# Patient Record
Sex: Male | Born: 1970
Health system: Southern US, Community
[De-identification: ages and names within clinical notes are randomized; demographics above are authoritative.]

## PROBLEM LIST (undated history)

## (undated) DIAGNOSIS — K429 Umbilical hernia without obstruction or gangrene: Secondary | ICD-10-CM

## (undated) DIAGNOSIS — K824 Cholesterolosis of gallbladder: Secondary | ICD-10-CM

---

## 2005-10-25 HISTORY — PX: ANTERIOR CRUCIATE LIGAMENT REPAIR: SHX115

## 2009-03-07 ENCOUNTER — Emergency Department (HOSPITAL_COMMUNITY): Admission: EM | Admit: 2009-03-07 | Discharge: 2009-03-07 | Payer: Self-pay | Admitting: Emergency Medicine

## 2010-01-07 IMAGING — CR DG CHEST 2V
2 series · 2 of 2 positions shown · non-contrast
Comparison: None.

CLINICAL DATA: 37-year-old male with chest pain on the left side.

CHEST - 2 VIEW

[w chest pa]
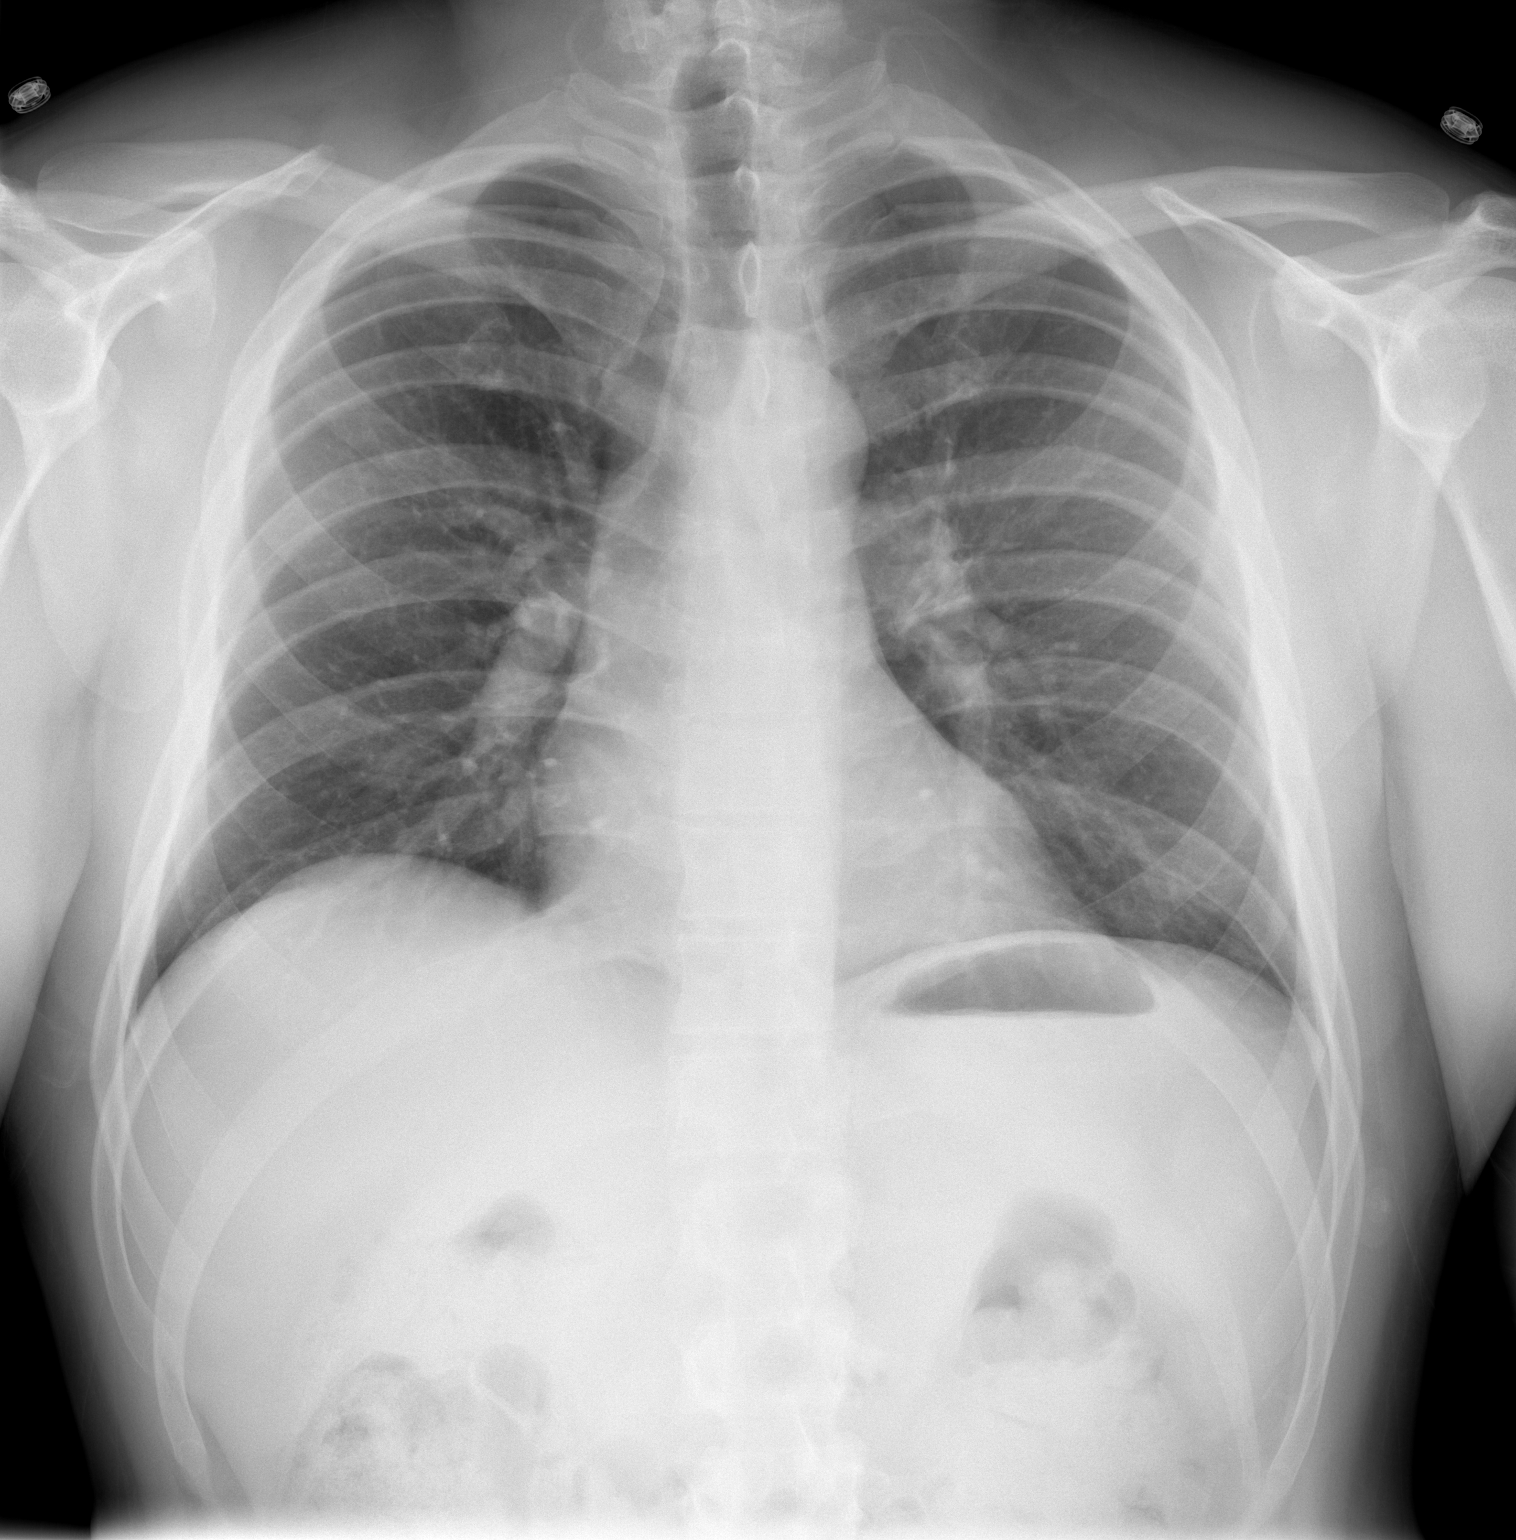

[w chest lat]
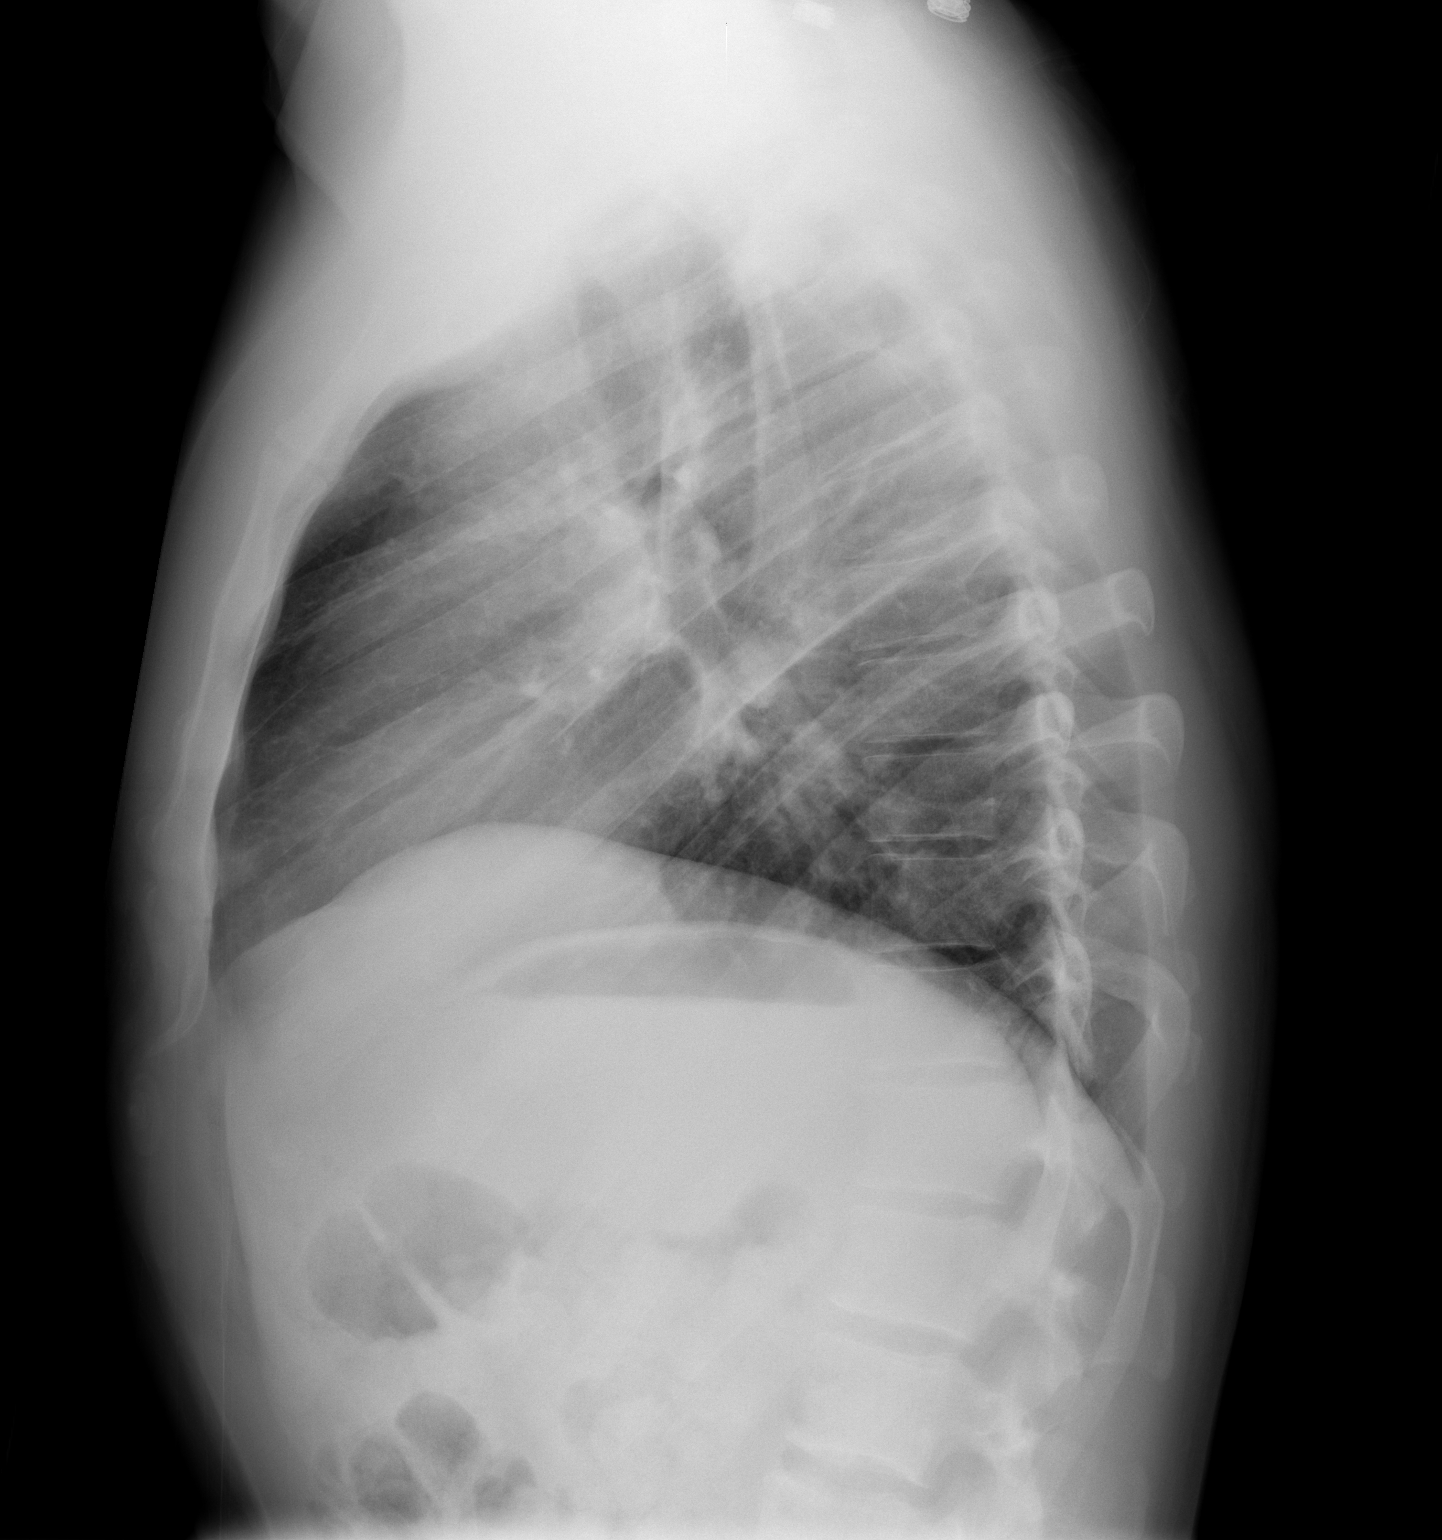

[2 of 2 positions shown; findings below may reference images not displayed]

FINDINGS: Shallow lung volumes.  Cardiac size and mediastinal
contours are within normal limits.  Crowding of markings at the
lung bases, otherwise the lungs are clear.  No pneumothorax or
pleural effusion. Visualized tracheal air column is within normal
limits.  No acute osseous abnormality identified.
IMPRESSION: Low lung volumes, otherwise no acute cardiopulmonary abnormality.

## 2010-11-12 ENCOUNTER — Ambulatory Visit
Admission: RE | Admit: 2010-11-12 | Discharge: 2010-11-12 | Payer: Self-pay | Source: Home / Self Care | Attending: Orthopedic Surgery | Admitting: Orthopedic Surgery

## 2010-11-18 NOTE — Op Note (Addendum)
  NAMEJOBE, MUTCH              ACCOUNT NO.:  [id number]  MEDICAL RECORD NO.:  [id number]          PATIENT TYPE:  AMB  LOCATION:  DSC                          FACILITY:  [facility]  PHYSICIAN:  Loreta Ave, M.D. DATE OF BIRTH:  11/19/1970  DATE OF PROCEDURE:  11/12/2010 DATE OF DISCHARGE:                              OPERATIVE REPORT   PREOPERATIVE DIAGNOSES:  Right shoulder chronic impingement, partial tearing of rotator cuff, and distal clavicle osteolysis.  POSTOPERATIVE DIAGNOSES:  Right shoulder chronic impingement, partial tearing of rotator cuff, and distal clavicle osteolysis.  With moderate tearing of supraspinatus tendon crescent region, on the bottom and top. Complex tearing, anterior and posterior superior labrum without biceps. Anchor detachment.  PROCEDURE:  Right shoulder exam under anesthesia, arthroscopy, debridement of labrum and rotator cuff.  Bursectomy, acromioplasty, coracoacromial ligament release.  Excision of distal clavicle.  SURGEON:  Loreta Ave, M.D.  ASSISTANT:  Zonia Kief, PA, present throughout the entire case; necessary for timely completion of procedure.  ANESTHESIA:  General.  BLOOD LOSS:  Minimal.  SPECIMENS:  None.  COUNTS:  None.  COMPLICATIONS:  None.  DRESSINGS:  Soft compressive sling.  PROCEDURE:  The patient was brought to the operating room, placed on the operating table in supine position.  After adequate anesthesia had been obtained, shoulder examined.  Good motion, a little increased external rotation, but no instability.  Placed in a beach-chair position on the shoulder positioner, prepped and draped in usual sterile fashion.  Three portals; anterior, posterior, and lateral.  Arthroscope was introduced. Shoulder distended and inspected.  Complex tearing, anterior and posterior superior labrum.  All this debrided back to a stable surface. The biceps tendon and anchor still looked good.  Fair amount of  tearing in the crescent region, supraspinatus tendon laterally.  Nothing full- thickness.  This was very anterior and not posterior and nothing that looked like internal impingement.  The infraspinatus looked great. Articular cartilage looked great.  Capsule and ligamentous structures intact.  Cannula redirected subacromially.  Marked reactive bursitis debrided.  Abrasive tearing on the top of the cuff, again nothing full- thickness.  Debrided it.  Acromioplasty from type 3 to type 1 acromion. CA ligament release.  Distal clavicle, grade 3 changes.  Periarticular spurs, lateral centimeter of clavicle resected.  At completion, adequacy of decompression confirmed, viewing from all portals. Instruments and fluid removed.  Portals closed with nylon.  Sterile compressive dressing applied.  Sling applied.  Anesthesia reversed. Brought to the recovery room.  Tolerated surgery well.  No complications.     Loreta Ave, M.D.     DFM/MEDQ  D:  11/12/2010  T:  11/13/2010  Job:  [id number]  Electronically Signed by Mckinley Jewel M.D. on 11/18/2010 04:21:52 PM

## 2011-02-02 LAB — CK TOTAL AND CKMB (NOT AT ARMC): CK, MB: 1.5 ng/mL (ref 0.3–4.0)

## 2011-02-02 LAB — DIFFERENTIAL
Basophils Absolute: 0 10*3/uL (ref 0.0–0.1)
Lymphocytes Relative: 20 % (ref 12–46)
Monocytes Absolute: 0.5 10*3/uL (ref 0.1–1.0)
Monocytes Relative: 6 % (ref 3–12)
Neutro Abs: 5.8 10*3/uL (ref 1.7–7.7)

## 2011-02-02 LAB — BASIC METABOLIC PANEL
CO2: 28 mEq/L (ref 19–32)
Chloride: 105 mEq/L (ref 96–112)
GFR calc Af Amer: >60 mL/min (ref >60)
Sodium: 138 mEq/L (ref 135–145)

## 2011-02-02 LAB — CBC
HCT: 42.8 % (ref 39.0–52.0)
Hemoglobin: 15 g/dL (ref 13.0–17.0)
MCHC: 35 g/dL (ref 30.0–36.0)
MCV: 87.1 fL (ref 78.0–100.0)
RBC: 4.92 MIL/uL (ref 4.22–5.81)

## 2011-03-09 NOTE — Consult Note (Signed)
[name], [name]              ACCOUNT NO.:  [id number]   MEDICAL RECORD NO.:  [id number]          PATIENT TYPE:  EMS   LOCATION:  MAJO                         FACILITY:  [facility]   PHYSICIAN:  [name], M.D.DATE OF BIRTH:  [birth year]   DATE OF CONSULTATION:  03/07/2009  DATE OF DISCHARGE:  03/07/2009                                 CONSULTATION   PRIMARY CARE PHYSICIAN:  [name], M.D.   CHIEF COMPLAINT:  Chest pain.   HISTORY OF PRESENT ILLNESS:  The patient is a 40 year old white male  with past medical history of hypertension, currently diet controlled who  had been in usual state of health.  He has been weaned off of his  antihypertensive medications for several months now and has actually  blood pressure has been monitored and looks to be within normal range.  He said that yesterday he worked out excessively after having some time  off and started having some severe pain over the right below the left  breast onto left axillary side.  When taking a deep inspiration he did  feel quite a bit of pain.  He noted this pain approximately 7:00 a.m.  this morning when he woke up.  He was up around 2:00 a.m. caring for his  son and at that time he had no pain whatsoever.  When he woke up at 7:00  this morning he said the pain was mild about 3-4/10 but quite  noticeable.  It did not radiate.  He did not really have associated  shortness of breath but again had pain when he took deep inspiration.  The pain did not migraine.  He had no nausea, vomiting, no  lightheadedness.  However, the symptoms continued to persist over the  course of the morning and seemed to get worse.  The patient knows me  personally and contacted me.  When I advised him that he needs to be  evaluated formally and was advised that his PCP would likely with  complaint of chest pain and pain with deep inspiration be referred to  the emergency room.  I arranged to me to the patient in the emergency  room myself.  On arrival to the emergency room the patient was sent in  and he had vitals checked.  His blood pressure was noted to be 147/82.  The rest of his vitals including respiratory rate, heart rate and  temperature were all completely normal.  When I evaluated the patient  while he still complained of severe pain.  He denies any headaches,  vision changes, dysphagia, palpitations, shortness of breath, wheezing,  coughing, fevers, chills and nausea, vomiting, abdominal pain,  hematuria, dysuria, constipation, diarrhea, focal numbness, weakness or  pain.   REVIEW OF SYSTEMS:  Otherwise negative.   PAST MEDICAL HISTORY:  Hypertension, diet-controlled.   MEDICATIONS:  He is taking some, recently last couple of weeks, over-the-  counter workout supplements, but no prescribed medications.   ALLERGIES:  He has an allergy to PENICILLIN.   SOCIAL HISTORY:  Denies any tobacco, alcohol or drug use.   FAMILY HISTORY:  Negative for  any heart disease.   PHYSICAL EXAM:  VITAL SIGNS:  The patient's vitals on admission 147/82.  This came down to 125/62 after the patient received some Vicodin.  He  noted however that this did not really change much quality of pain.  However, respiratory rate is 18, temperature afebrile, heart rate 74.  GENERAL:  He is alert and oriented x3.  He does not look to be in mild  distress, even though he complains of pain being quite significant about  7-8/10.  HEENT:  Normocephalic, atraumatic.  Mucous membranes are moist.  He has  no carotid bruits.  HEART:  Regular rate and rhythm.  S1-S2.  LUNGS:  Clear to auscultation bilaterally.  ABDOMEN:  Soft, nontender and nondistended.  Positive bowel sounds.  EXTREMITIES:  No clubbing, cyanosis or edema.  He has some focal  tenderness underneath his left breast in that area.  It does not go into  his abdominal cavity or abdominal region.   LAB WORK:  A chest x-ray noted lower lung volumes secondary to  suboptimal  inspiration.  No signs of any focal infiltrates.  EKG showed  a completely normal sinus rhythm, no ST-T wave abnormalities.  D-dimer  less than 0.22, troponin-I 0.01.  CPK 116, MB 1.5, sodium 138, potassium  3.8, chloride 105, BUN 14, creatinine 1.2, glucose 94, white count 8.1  no shift, H and H 15 and 43, MCV 87, platelet count 213.   ASSESSMENT/PLAN:  Musculoskeletal chest pain.  The patient was ruled out  for a deep venous thrombosis with a normal D-dimer.  He has low index of  suspicion for heart history given his lack of family history, risk  factors with a blood pressure that is diet controlled.  He is in  excellent physical condition and notes some pain in that area with more  moving or twisting.  Normal EKG and normal cardiac markers.  I am  comfortable saying that this is almost certainly musculoskeletal chest  pain.  We initially responded with trying some Vicodin this did not seem  to do much so therefore I am discharging the patient.  The plan will be  discharging the patient on 10 mg of Toradol q.8 hours for 24 hours then  10 mg q.8 hours p.r.n. for no more than four more days.  I advised the  patient to drink lots of water with Toradol.  I am also giving him  Percocet 5/325 one p.o. q.6 hours p.r.n. for pain a total #20.  Advised  the patient that if the symptoms have not improved over the next few  days or worsening to contact his PCP, or if he has any radiation of pain  elsewhere.  Although again I am comfortable now thinking that this is  not a life-threatening illness or injury requiring hospital admission.      [name], M.D.  Electronically Signed     SKK/MEDQ  D:  03/07/2009  T:  03/08/2009  Job:  [id number]   cc:   Dr. Heath Snyder

## 2012-10-25 HISTORY — PX: ROTATOR CUFF REPAIR: SHX139

## 2017-10-14 DIAGNOSIS — Z3009 Encounter for other general counseling and advice on contraception: Secondary | ICD-10-CM | POA: Diagnosis not present

## 2017-12-30 DIAGNOSIS — Z302 Encounter for sterilization: Secondary | ICD-10-CM | POA: Diagnosis not present

## 2018-01-04 DIAGNOSIS — B354 Tinea corporis: Secondary | ICD-10-CM | POA: Diagnosis not present

## 2018-01-04 DIAGNOSIS — R5383 Other fatigue: Secondary | ICD-10-CM | POA: Diagnosis not present

## 2018-01-04 DIAGNOSIS — F411 Generalized anxiety disorder: Secondary | ICD-10-CM | POA: Diagnosis not present

## 2018-01-04 DIAGNOSIS — E559 Vitamin D deficiency, unspecified: Secondary | ICD-10-CM | POA: Diagnosis not present

## 2018-07-19 ENCOUNTER — Encounter (HOSPITAL_COMMUNITY): Payer: Self-pay | Admitting: Emergency Medicine

## 2018-07-19 ENCOUNTER — Ambulatory Visit (HOSPITAL_COMMUNITY)
Admit: 2018-07-19 | Discharge: 2018-07-19 | Disposition: A | Discharge status: Home / Self Care | Payer: BLUE CROSS/BLUE SHIELD | Attending: Family Medicine | Admitting: Family Medicine

## 2018-07-19 ENCOUNTER — Ambulatory Visit (HOSPITAL_COMMUNITY)
Admission: EM | Admit: 2018-07-19 | Discharge: 2018-07-19 | Disposition: A | Discharge status: Home / Self Care | Payer: BLUE CROSS/BLUE SHIELD | Attending: Family Medicine | Admitting: Family Medicine

## 2018-07-19 DIAGNOSIS — K824 Cholesterolosis of gallbladder: Secondary | ICD-10-CM | POA: Diagnosis not present

## 2018-07-19 DIAGNOSIS — K420 Umbilical hernia with obstruction, without gangrene: Secondary | ICD-10-CM | POA: Insufficient documentation

## 2018-07-19 DIAGNOSIS — Z880 Allergy status to penicillin: Secondary | ICD-10-CM | POA: Diagnosis not present

## 2018-07-19 DIAGNOSIS — R1010 Upper abdominal pain, unspecified: Secondary | ICD-10-CM | POA: Diagnosis not present

## 2018-07-19 MED ORDER — ACETAMINOPHEN 325 MG PO TABS
650.0000 mg | ORAL_TABLET | Freq: Once | ORAL | Status: AC
  Administered 2018-07-19: 650 mg via ORAL

## 2018-07-19 MED ORDER — HYDROCODONE-ACETAMINOPHEN 5-325 MG PO TABS
ORAL_TABLET | ORAL | Status: AC
  Filled 2018-07-19: qty 1

## 2018-07-19 MED ORDER — ACETAMINOPHEN 325 MG PO TABS
ORAL_TABLET | ORAL | Status: AC
  Filled 2018-07-19: qty 2

## 2018-07-19 MED ORDER — HYDROCODONE-ACETAMINOPHEN 5-325 MG PO TABS: 1.0000 | ORAL_TABLET | Freq: Four times a day (QID) | ORAL | 0 refills | Status: DC | PRN

## 2018-07-19 MED ORDER — HYDROCODONE-ACETAMINOPHEN 5-325 MG PO TABS: 1.0000 | ORAL_TABLET | Freq: Once | ORAL | Status: DC

## 2018-07-19 NOTE — Discharge Instructions (Signed)
It was nice meeting you!!  Your ultrasound is scheduled for 1:45 today at [facility] Please arrive there 15 minutes before your appointment I will prescribe you some hydrocodone for pain

## 2018-07-19 NOTE — ED Notes (Signed)
PT has [place] appt at [facility] at 1:45 pm

## 2018-07-19 NOTE — ED Triage Notes (Signed)
PT reports epigastric pain that occurred earlier this week, but resolved. Pain returned last night. PT reports swelling in area and is concerned about a hernia.

## 2018-07-19 NOTE — ED Notes (Signed)
Bed: UC01 Expected date:  Expected time:  Means of arrival:  Comments: Appointments 

## 2018-07-19 NOTE — ED Provider Notes (Signed)
[facility]    CSN: [id number] Arrival date & time: 07/19/18  0932     History   Chief Complaint Chief Complaint  Patient presents with  . Abdominal Pain    HPI Breylan Lefevers is a 47 y.o. male.   Patient is a 47 year old male that presents with mid and upper abdominal pain worsening since Friday.  Most of the pain is around the umbilicus with swelling.  Very tender to touch.  He denies any associated nausea, vomiting, diarrhea, constipation.  Last night he had a hard time sleeping due to the pain.  He has not taken anything for pain.  He denies any recent heavy lifting.  He has been exercising but denies any strenuous exercise.  Denies any history of hernias.  He denies any chest pain, shortness of breath, acid reflux.  ROS per HPI      History reviewed. No pertinent past medical history.  There are no active problems to display for this patient.   Past Surgical History:  Procedure Laterality Date  . ANTERIOR CRUCIATE LIGAMENT REPAIR Right   . ROTATOR CUFF REPAIR         Home Medications    Prior to Admission medications   Medication Sig Start Date End Date Taking? Authorizing Provider  HYDROcodone-acetaminophen (NORCO/VICODIN) 5-325 MG tablet Take 1-2 tablets by mouth every 6 (six) hours as needed. 07/19/18   [name], NP    Family History History reviewed. No pertinent family history.  Social History Social History   Tobacco Use  . Smoking status: Never Smoker  . Smokeless tobacco: Never Used  Substance Use Topics  . Alcohol use: Yes  . Drug use: Never     Allergies   Penicillins   Review of Systems Review of Systems   Physical Exam Triage Vital Signs ED Triage Vitals  Enc Vitals Group     BP 07/19/18 1003 (!) 132/91     Pulse Rate 07/19/18 1003 (!) 56     Resp 07/19/18 1003 16     Temp 07/19/18 1003 98 F (36.7 C)     Temp Source 07/19/18 1003 Oral     SpO2 07/19/18 1003 98 %     Weight 07/19/18 1002 193 lb (87.5 kg)      Height --      Head Circumference --      Peak Flow --      Pain Score 07/19/18 1002 7     Pain Loc --      Pain Edu? --      Excl. in GC? --    No data found.  Updated Vital Signs BP (!) 132/91   Pulse (!) 56   Temp 98 F (36.7 C) (Oral)   Resp 16   Wt 193 lb (87.5 kg)   SpO2 98%   Visual Acuity Right Eye Distance:   Left Eye Distance:   Bilateral Distance:    Right Eye Near:   Left Eye Near:    Bilateral Near:     Physical Exam  Constitutional: He appears well-developed and well-nourished.  Appears uncomfortable.   HENT:  Head: Normocephalic and atraumatic.  Pulmonary/Chest: Effort normal.  Abdominal: Bowel sounds are decreased.    Bulging umbilical hernia more on the left side.  Very tender to touch.  No erythema. Nontender to lower abdomen.  No rebound Mildly tender to palpation of right and left upper quadrant. No masses felt  Nursing note and vitals reviewed.    UC  Treatments / Results  Labs (all labs ordered are listed, but only abnormal results are displayed) Labs Reviewed - No data to display  EKG None  Radiology [place] Abdomen Complete  Result Date: 07/19/2018 CLINICAL DATA:  Upper abdomen pain, assess for strangulated hernia. EXAM: ABDOMEN ULTRASOUND COMPLETE COMPARISON:  None. FINDINGS: Gallbladder: There are polyps within the gallbladder, largest measures 6 mm. There is no sonographic Murphy sign noted by sonographer. Common bile duct: Diameter: 4 mm Liver: No focal lesion identified. Within normal limits in parenchymal echogenicity. Portal vein is patent on color Doppler imaging with normal direction of blood flow towards the liver. IVC: No abnormality visualized. Pancreas: Visualized portion unremarkable. Spleen: Size and appearance within normal limits. Right Kidney: Length: 10.7 cm. Echogenicity within normal limits. No mass or hydronephrosis visualized. Left Kidney: Length: 12.6 cm. Echogenicity within normal limits. No mass or hydronephrosis  visualized. Abdominal aorta: No aneurysm visualized. Other findings: Umbilicus area was imaged for assessment of hernia. There is a 3.8 x 2.7 x 2.9 cm focal hyperechogenicity with color flow but no peristalsis is noted. This may represent focal herniated mesenteric fat. IMPRESSION: There is 3.8 cm focal hyper echogenic area without peristalsis in the umbilical area. This could represent focal herniated mesenteric fat. Gallbladder polyps. Electronically Signed   By: Sherian ReinWei-Chen  Lin M.D.   On: 07/19/2018 14:42    Procedures Procedures (including critical care time)  Medications Ordered in UC Medications  acetaminophen (TYLENOL) tablet 650 mg (650 mg Oral Given 07/19/18 1114)    Initial Impression / Assessment and Plan / UC Course  I have reviewed the triage vital signs and the nursing notes.  Pertinent labs & imaging results that were available during my care of the patient were reviewed by me and considered in my medical decision making (see chart for details).     Sending patient for ultrasound to rule out strangulated umbilical hernia.  VSS, non toxic or ill appearing.   US revealed mesenteric fat. Consulted with surgery and they advised to manage pain and have him follow up in the office this week. Pt made aware of results and to follow up.  Hydrocodone for pain.  For worsening symptoms go to the ER.  Final Clinical Impressions(s) / UC Diagnoses   Final diagnoses:  Umbilical hernia with obstruction, without gangrene     Discharge Instructions     It was nice meeting you!!  Your [facility] 1:45 today [facility] Please arrive there 15 minutes before your appointment I will prescribe you some hydrocodone for pain     ED Prescriptions    Medication Sig Dispense Auth. Provider   HYDROcodone-acetaminophen (NORCO/VICODIN) 5-325 MG tablet Take 1-2 tablets by mouth every 6 (six) hours as needed. 10 tablet Dahlia ByesBast, Tasnim Balentine A, NP     Controlled Substance  Prescriptions [place] Controlled Substance Registry consulted? Not Applicable   Janace ArisBast, Kayleen Alig A, NP 07/20/18 1000    Isa RankinMurray, Laura Wilson, MD 08/01/18 1530

## 2018-07-20 ENCOUNTER — Encounter (HOSPITAL_COMMUNITY): Payer: Self-pay | Admitting: Family Medicine

## 2018-07-20 DIAGNOSIS — K429 Umbilical hernia without obstruction or gangrene: Secondary | ICD-10-CM | POA: Diagnosis not present

## 2018-07-21 ENCOUNTER — Encounter (HOSPITAL_BASED_OUTPATIENT_CLINIC_OR_DEPARTMENT_OTHER): Payer: Self-pay

## 2018-07-21 ENCOUNTER — Other Ambulatory Visit: Payer: Self-pay

## 2018-07-21 NOTE — Progress Notes (Signed)
Spoke with:  Jeffree (goes by [employer]) NPO:  After Midnight, no gum, candy, or mints   Arrival time: 10:45AM Labs:  N/A AM medications: None Pre op orders: No Ride home:  Cala Bradford (wife) [phone]

## 2018-07-24 ENCOUNTER — Ambulatory Visit (HOSPITAL_BASED_OUTPATIENT_CLINIC_OR_DEPARTMENT_OTHER): Payer: BLUE CROSS/BLUE SHIELD | Admitting: Anesthesiology

## 2018-07-24 ENCOUNTER — Encounter (HOSPITAL_BASED_OUTPATIENT_CLINIC_OR_DEPARTMENT_OTHER): Payer: Self-pay

## 2018-07-24 ENCOUNTER — Ambulatory Visit (HOSPITAL_BASED_OUTPATIENT_CLINIC_OR_DEPARTMENT_OTHER)
Admission: RE | Admit: 2018-07-24 | Discharge: 2018-07-24 | Disposition: A | Discharge status: Home / Self Care | Payer: BLUE CROSS/BLUE SHIELD | Source: Ambulatory Visit | Attending: General Surgery | Admitting: General Surgery

## 2018-07-24 ENCOUNTER — Encounter (HOSPITAL_BASED_OUTPATIENT_CLINIC_OR_DEPARTMENT_OTHER)
Admission: RE | Disposition: A | Discharge status: Home / Self Care | Payer: Self-pay | Source: Ambulatory Visit | Attending: General Surgery

## 2018-07-24 DIAGNOSIS — K420 Umbilical hernia with obstruction, without gangrene: Secondary | ICD-10-CM | POA: Diagnosis not present

## 2018-07-24 DIAGNOSIS — K429 Umbilical hernia without obstruction or gangrene: Secondary | ICD-10-CM | POA: Diagnosis not present

## 2018-07-24 DIAGNOSIS — Z880 Allergy status to penicillin: Secondary | ICD-10-CM | POA: Insufficient documentation

## 2018-07-24 HISTORY — PX: UMBILICAL HERNIA REPAIR: SHX196

## 2018-07-24 HISTORY — DX: Umbilical hernia without obstruction or gangrene: K42.9

## 2018-07-24 HISTORY — DX: Cholesterolosis of gallbladder: K82.4

## 2018-07-24 SURGERY — REPAIR, HERNIA, UMBILICAL, ADULT
Anesthesia: General | Site: Abdomen

## 2018-07-24 MED ORDER — BUPIVACAINE HCL 0.5 % IJ SOLN
INTRAMUSCULAR | Status: DC | PRN
  Administered 2018-07-24: 30 mL

## 2018-07-24 MED ORDER — ONDANSETRON HCL 4 MG/2ML IJ SOLN
4.0000 mg | Freq: Once | INTRAMUSCULAR | Status: DC | PRN
  Filled 2018-07-24: qty 2

## 2018-07-24 MED ORDER — CELECOXIB 200 MG PO CAPS
ORAL_CAPSULE | ORAL | Status: AC
  Filled 2018-07-24: qty 1

## 2018-07-24 MED ORDER — OXYCODONE HCL 5 MG/5ML PO SOLN
5.0000 mg | Freq: Once | ORAL | Status: DC | PRN
  Filled 2018-07-24: qty 5

## 2018-07-24 MED ORDER — FENTANYL CITRATE (PF) 100 MCG/2ML IJ SOLN
INTRAMUSCULAR | Status: DC | PRN
  Administered 2018-07-24: 50 ug via INTRAVENOUS
  Administered 2018-07-24: 100 ug via INTRAVENOUS

## 2018-07-24 MED ORDER — SUGAMMADEX SODIUM 200 MG/2ML IV SOLN
INTRAVENOUS | Status: DC | PRN
  Administered 2018-07-24: 200 mg via INTRAVENOUS

## 2018-07-24 MED ORDER — PROPOFOL 10 MG/ML IV BOLUS
INTRAVENOUS | Status: DC | PRN
  Administered 2018-07-24: 200 mg via INTRAVENOUS

## 2018-07-24 MED ORDER — CHLORHEXIDINE GLUCONATE 4 % EX LIQD
60.0000 mL | Freq: Once | CUTANEOUS | Status: DC
  Filled 2018-07-24: qty 118

## 2018-07-24 MED ORDER — MIDAZOLAM HCL 5 MG/5ML IJ SOLN
INTRAMUSCULAR | Status: DC | PRN
  Administered 2018-07-24: 2 mg via INTRAVENOUS

## 2018-07-24 MED ORDER — OXYCODONE HCL 5 MG PO TABS
5.0000 mg | ORAL_TABLET | Freq: Once | ORAL | Status: DC | PRN
  Filled 2018-07-24: qty 1

## 2018-07-24 MED ORDER — MIDAZOLAM HCL 2 MG/2ML IJ SOLN
INTRAMUSCULAR | Status: AC
  Filled 2018-07-24: qty 2

## 2018-07-24 MED ORDER — DEXAMETHASONE SODIUM PHOSPHATE 4 MG/ML IJ SOLN
INTRAMUSCULAR | Status: DC | PRN
  Administered 2018-07-24: 10 mg via INTRAVENOUS

## 2018-07-24 MED ORDER — GABAPENTIN 300 MG PO CAPS
300.0000 mg | ORAL_CAPSULE | ORAL | Status: AC
  Administered 2018-07-24: 300 mg via ORAL
  Filled 2018-07-24: qty 1

## 2018-07-24 MED ORDER — CELECOXIB 200 MG PO CAPS
200.0000 mg | ORAL_CAPSULE | ORAL | Status: AC
  Administered 2018-07-24: 200 mg via ORAL
  Filled 2018-07-24: qty 1

## 2018-07-24 MED ORDER — GENTAMICIN SULFATE 40 MG/ML IJ SOLN
5.0000 mg/kg | INTRAVENOUS | Status: AC
  Administered 2018-07-24: 440 mg via INTRAVENOUS
  Filled 2018-07-24 (×2): qty 11

## 2018-07-24 MED ORDER — ONDANSETRON HCL 4 MG/2ML IJ SOLN
INTRAMUSCULAR | Status: AC
  Filled 2018-07-24: qty 2

## 2018-07-24 MED ORDER — LACTATED RINGERS IV SOLN
INTRAVENOUS | Status: DC
  Administered 2018-07-24 (×3): via INTRAVENOUS
  Filled 2018-07-24: qty 1000

## 2018-07-24 MED ORDER — FENTANYL CITRATE (PF) 100 MCG/2ML IJ SOLN
25.0000 ug | INTRAMUSCULAR | Status: DC | PRN
  Filled 2018-07-24: qty 1

## 2018-07-24 MED ORDER — PROPOFOL 10 MG/ML IV BOLUS
INTRAVENOUS | Status: AC
  Filled 2018-07-24: qty 20

## 2018-07-24 MED ORDER — CLINDAMYCIN PHOSPHATE 900 MG/50ML IV SOLN
INTRAVENOUS | Status: AC
  Filled 2018-07-24: qty 50

## 2018-07-24 MED ORDER — DEXAMETHASONE SODIUM PHOSPHATE 10 MG/ML IJ SOLN
INTRAMUSCULAR | Status: AC
  Filled 2018-07-24: qty 1

## 2018-07-24 MED ORDER — CLINDAMYCIN PHOSPHATE 900 MG/50ML IV SOLN
900.0000 mg | INTRAVENOUS | Status: AC
  Administered 2018-07-24: 900 mg via INTRAVENOUS
  Filled 2018-07-24: qty 50

## 2018-07-24 MED ORDER — FENTANYL CITRATE (PF) 250 MCG/5ML IJ SOLN
INTRAMUSCULAR | Status: AC
  Filled 2018-07-24: qty 5

## 2018-07-24 MED ORDER — SUGAMMADEX SODIUM 200 MG/2ML IV SOLN
INTRAVENOUS | Status: AC
  Filled 2018-07-24: qty 2

## 2018-07-24 MED ORDER — HYDROCODONE-ACETAMINOPHEN 5-325 MG PO TABS: 1.0000 | ORAL_TABLET | Freq: Four times a day (QID) | ORAL | 0 refills | Status: DC | PRN

## 2018-07-24 MED ORDER — ROCURONIUM BROMIDE 10 MG/ML (PF) SYRINGE
PREFILLED_SYRINGE | INTRAVENOUS | Status: AC
  Filled 2018-07-24: qty 10

## 2018-07-24 MED ORDER — ACETAMINOPHEN 500 MG PO TABS
1000.0000 mg | ORAL_TABLET | ORAL | Status: AC
  Administered 2018-07-24: 1000 mg via ORAL
  Filled 2018-07-24: qty 2

## 2018-07-24 MED ORDER — ONDANSETRON HCL 4 MG/2ML IJ SOLN
INTRAMUSCULAR | Status: DC | PRN
  Administered 2018-07-24: 4 mg via INTRAVENOUS

## 2018-07-24 MED ORDER — GABAPENTIN 300 MG PO CAPS
ORAL_CAPSULE | ORAL | Status: AC
  Filled 2018-07-24: qty 1

## 2018-07-24 MED ORDER — IBUPROFEN 800 MG PO TABS: 800.0000 mg | ORAL_TABLET | Freq: Three times a day (TID) | ORAL | 0 refills | Status: AC | PRN

## 2018-07-24 MED ORDER — ACETAMINOPHEN 500 MG PO TABS
ORAL_TABLET | ORAL | Status: AC
  Filled 2018-07-24: qty 2

## 2018-07-24 MED ORDER — BUPIVACAINE LIPOSOME 1.3 % IJ SUSP
INTRAMUSCULAR | Status: DC | PRN
  Administered 2018-07-24: 20 mL

## 2018-07-24 MED ORDER — ROCURONIUM BROMIDE 100 MG/10ML IV SOLN
INTRAVENOUS | Status: DC | PRN
  Administered 2018-07-24: 50 mg via INTRAVENOUS

## 2018-07-24 MED ORDER — LIDOCAINE 2% (20 MG/ML) 5 ML SYRINGE
INTRAMUSCULAR | Status: AC
  Filled 2018-07-24: qty 5

## 2018-07-24 SURGICAL SUPPLY — 48 items
BLADE HEX COATED 2.75 (ELECTRODE) ×2 IMPLANT
BLADE SURG 15 STRL LF DISP TIS (BLADE) ×1 IMPLANT
BLADE SURG 15 STRL SS (BLADE) ×1
BLADE SURG ROTATE 9660 (MISCELLANEOUS) IMPLANT
CANISTER SUCT 3000ML PPV (MISCELLANEOUS) IMPLANT
CELLS DAT CNTRL 66122 CELL SVR (MISCELLANEOUS) IMPLANT
CHLORAPREP W/TINT 26ML (MISCELLANEOUS) ×2 IMPLANT
COVER BACK TABLE 60X90IN (DRAPES) ×2 IMPLANT
COVER MAYO STAND STRL (DRAPES) ×2 IMPLANT
DECANTER SPIKE VIAL GLASS SM (MISCELLANEOUS) IMPLANT
DERMABOND ADVANCED (GAUZE/BANDAGES/DRESSINGS) ×1
DERMABOND ADVANCED .7 DNX12 (GAUZE/BANDAGES/DRESSINGS) ×1 IMPLANT
DRAPE LAPAROSCOPIC ABDOMINAL (DRAPES) ×2 IMPLANT
DRAPE UTILITY XL STRL (DRAPES) ×2 IMPLANT
ELECT REM PT RETURN 9FT ADLT (ELECTROSURGICAL) ×2
ELECTRODE REM PT RTRN 9FT ADLT (ELECTROSURGICAL) ×1 IMPLANT
GLOVE BIOGEL PI IND STRL 7.0 (GLOVE) ×1 IMPLANT
GLOVE BIOGEL PI IND STRL 7.5 (GLOVE) ×1 IMPLANT
GLOVE BIOGEL PI IND STRL 8.5 (GLOVE) ×1 IMPLANT
GLOVE BIOGEL PI INDICATOR 7.0 (GLOVE) ×1
GLOVE BIOGEL PI INDICATOR 7.5 (GLOVE) ×1
GLOVE BIOGEL PI INDICATOR 8.5 (GLOVE) ×1
GLOVE INDICATOR 8.5 STRL (GLOVE) ×2 IMPLANT
GLOVE SURG SS PI 7.0 STRL IVOR (GLOVE) ×2 IMPLANT
GOWN STRL REUS W/TWL LRG LVL3 (GOWN DISPOSABLE) ×4 IMPLANT
KIT TURNOVER CYSTO (KITS) ×2 IMPLANT
NEEDLE HYPO 25X1 1.5 SAFETY (NEEDLE) ×2 IMPLANT
NS IRRIG 500ML POUR BTL (IV SOLUTION) ×2 IMPLANT
PACK BASIN DAY SURGERY FS (CUSTOM PROCEDURE TRAY) ×2 IMPLANT
PENCIL BUTTON HOLSTER BLD 10FT (ELECTRODE) ×2 IMPLANT
RTRCTR WOUND ALEXIS 18CM MED (MISCELLANEOUS)
RTRCTR WOUND ALEXIS 18CM SML (INSTRUMENTS)
SAVER CELL AAL HAEMONETICS (INSTRUMENTS) IMPLANT
SPONGE LAP 4X18 RFD (DISPOSABLE) ×2 IMPLANT
SUT MNCRL AB 4-0 PS2 18 (SUTURE) ×2 IMPLANT
SUT NOVA NAB GS-21 0 18 T12 DT (SUTURE) ×2 IMPLANT
SUT PDS AB 0 CT1 36 (SUTURE) IMPLANT
SUT PROLENE 0 CT 1 CR/8 (SUTURE) ×2 IMPLANT
SUT VIC AB 0 SH 27 (SUTURE) IMPLANT
SUT VIC AB 2-0 SH 27 (SUTURE)
SUT VIC AB 2-0 SH 27XBRD (SUTURE) IMPLANT
SUT VIC AB 3-0 SH 27 (SUTURE) ×1
SUT VIC AB 3-0 SH 27X BRD (SUTURE) ×1 IMPLANT
SYR BULB IRRIGATION 50ML (SYRINGE) ×2 IMPLANT
SYR CONTROL 10ML LL (SYRINGE) ×2 IMPLANT
TOWEL OR 17X24 6PK STRL BLUE (TOWEL DISPOSABLE) ×4 IMPLANT
TUBE CONNECTING 12X1/4 (SUCTIONS) ×2 IMPLANT
YANKAUER SUCT BULB TIP NO VENT (SUCTIONS) ×2 IMPLANT

## 2018-07-24 NOTE — Op Note (Signed)
PATIENT:  [name]  47 y.o. male  PRE-OPERATIVE DIAGNOSIS:  umbilical hernia  POST-OPERATIVE DIAGNOSIS:  umbilical hernia  PROCEDURE:  Procedure(s): open umbilical hernia   SURGEON:  Surgeon(s): Keta Vanvalkenburgh, De Blanch, MD  ASSISTANT: none   ANESTHESIA:   local and general  Indications for procedure: [name] is a 47 y.o. year old male with symptoms of abdominal pain and findings of small incarcerated umbilical hernia with fat in sac noted on imaging.  Description of procedure: The patient was brought into the operative suite. Anesthesia was administered with General endotracheal anesthesia. WHO checklist was applied. The patient was then placed in supine. The area was prepped and draped in the usual sterile fashion.  Next the infraumbilical skin was anesthetized with Exparel:Marcaine. A semilunar infraumbilical incision was made. Cautery and blunt dissection was used to dissect down to the fascia. The hernia sac was dissected free from surrounding tissues in 360 degrees. The umbilical skin was dissected free of the hernia sac with cautery. The sac contained omentum which was divided and removed. Cautery was used to ensure hemostasis of the omental tip. The contents of the hernia sac were reduced. The hernia defect was 1 cm in diameter. The hernia sac was removed. Due to the size of the hernia, no mesh was utilized. The fascial defect was then primarily closed with 3 interrupted 0 prolene sutures. The stalk was kept in place during the surgery. The deep dermal space was closed with a 3-0 vicryl. Exparel:Marcaine was injected into the muscle layer and around the fascia. The skin was closed with a 4-0 monocryl subcuticular suture. Dermabond was put in place for dressing. The patient awoke from anesthesia and was brought to pacu in stable condition. All counts were correct.  Findings: 1cm umbilical hernia containing omentum  Specimen: none  Blood loss: <10 ml  Local anesthesia: 50  ml Exparel: Marcaine mix  Complications: none  PLAN OF CARE: Discharge to home after PACU  PATIENT DISPOSITION:  PACU - hemodynamically stable.  Feliciana Rossetti, M.D. General, Bariatric, & Minimally Invasive Surgery [facility], [place]  07/24/2018 12:25 PM

## 2018-07-24 NOTE — Anesthesia Preprocedure Evaluation (Addendum)
Anesthesia Evaluation  Patient identified by MRN, date of birth, ID band Patient awake    Reviewed: Allergy & Precautions, NPO status , Patient's Chart, lab work & pertinent test results  History of Anesthesia Complications Negative for: history of anesthetic complications  Airway Mallampati: I  TM Distance: >3 FB Neck ROM: Full    Dental no notable dental hx.    Pulmonary neg pulmonary ROS,    Pulmonary exam normal        Cardiovascular negative cardio ROS Normal cardiovascular exam     Neuro/Psych negative neurological ROS  negative psych ROS   GI/Hepatic negative GI ROS, Neg liver ROS,   Endo/Other  negative endocrine ROS  Renal/GU negative Renal ROS  negative genitourinary   Musculoskeletal negative musculoskeletal ROS (+)   Abdominal   Peds  Hematology negative hematology ROS (+)   Anesthesia Other Findings   Reproductive/Obstetrics                            Anesthesia Physical Anesthesia Plan  ASA: I  Anesthesia Plan: General   Post-op Pain Management:    Induction: Intravenous  PONV Risk Score and Plan: 2 and Ondansetron, Dexamethasone, Treatment may vary due to age or medical condition and Midazolam  Airway Management Planned: Oral ETT  Additional Equipment: None  Intra-op Plan:   Post-operative Plan: Extubation in OR  Informed Consent: I have reviewed the patients History and Physical, chart, labs and discussed the procedure including the risks, benefits and alternatives for the proposed anesthesia with the patient or authorized representative who has indicated his/her understanding and acceptance.     Plan Discussed with:   Anesthesia Plan Comments:       Anesthesia Quick Evaluation

## 2018-07-24 NOTE — Anesthesia Postprocedure Evaluation (Signed)
Anesthesia Post Note  Patient: [name]  Procedure(s) Performed: open umbilical hernia (N/A Abdomen)     Patient location during evaluation: PACU Anesthesia Type: General Level of consciousness: awake and alert Pain management: pain level controlled Vital Signs Assessment: post-procedure vital signs reviewed and stable Respiratory status: spontaneous breathing, nonlabored ventilation, respiratory function stable and patient connected to nasal cannula oxygen Cardiovascular status: blood pressure returned to baseline and stable Postop Assessment: no apparent nausea or vomiting Anesthetic complications: no    Last Vitals:  Vitals:   07/24/18 1300 07/24/18 1315  BP: 127/85 124/84  Pulse: 70 (!) 56  Resp: 12 12  Temp:    SpO2: 99% 97%    Last Pain:  Vitals:   07/24/18 1315  TempSrc:   PainSc: 0-No pain                 Lucretia Kern

## 2018-07-24 NOTE — H&P (Signed)
[name] is an 47 y.o. male.   Chief Complaint: abdominal pain HPI: 47 yo male with abdominal pain found to have an umbilical hernia. It was causing daily pain it has not been as sever over the weekend. He denies nausea or vomiting.  Past Medical History:  Diagnosis Date  . Gallbladder polyp   . Umbilical hernia     Past Surgical History:  Procedure Laterality Date  . ANTERIOR CRUCIATE LIGAMENT REPAIR Right 2007  . ROTATOR CUFF REPAIR Right 2014    History reviewed. No pertinent family history. Social History:  reports that he has never smoked. He has never used smokeless tobacco. He reports that he drinks alcohol. He reports that he does not use drugs.  Allergies:  Allergies  Allergen Reactions  . Penicillins Rash    Medications Prior to Admission  Medication Sig Dispense Refill  . HYDROcodone-acetaminophen (NORCO/VICODIN) 5-325 MG tablet Take 1-2 tablets by mouth every 6 (six) hours as needed. 10 tablet 0    No results found for this or any previous visit (from the past 48 hour(s)). No results found.  Review of Systems  Constitutional: Negative for chills and fever.  HENT: Negative for hearing loss.   Eyes: Negative for blurred vision and double vision.  Respiratory: Negative for cough and hemoptysis.   Cardiovascular: Negative for chest pain and palpitations.  Gastrointestinal: Positive for abdominal pain. Negative for nausea and vomiting.  Genitourinary: Negative for dysuria and urgency.  Musculoskeletal: Negative for myalgias and neck pain.  Skin: Negative for itching and rash.  Neurological: Negative for dizziness, tingling and headaches.  Endo/Heme/Allergies: Does not bruise/bleed easily.  Psychiatric/Behavioral: Negative for depression and suicidal ideas.    Blood pressure (!) 147/99, pulse 60, temperature 98.7 F (37.1 C), temperature source Oral, resp. rate 16, height 5\' 10"  (1.778 m), weight 89 kg, SpO2 100 %. Physical Exam  Vitals  reviewed. Constitutional: He is oriented to person, place, and time. He appears well-developed and well-nourished.  HENT:  Head: Normocephalic and atraumatic.  Eyes: Pupils are equal, round, and reactive to light. Conjunctivae and EOM are normal.  Neck: Normal range of motion. Neck supple.  Cardiovascular: Normal rate and regular rhythm.  Respiratory: Effort normal and breath sounds normal.  GI: Soft. Bowel sounds are normal. He exhibits no distension. There is no tenderness.  Umbilical hernia  Musculoskeletal: Normal range of motion.  Neurological: He is alert and oriented to person, place, and time.  Skin: Skin is warm and dry.  Psychiatric: He has a normal mood and affect. His behavior is normal.     Assessment/Plan 47 yo male with symptomatic small umbilical hernia -open umbilical hernia repair with mesh -enhanced recovery -outpatient procedure  Rodman Pickle, MD 07/24/2018, 11:32 AM

## 2018-07-24 NOTE — Anesthesia Procedure Notes (Signed)
Procedure Name: Intubation Date/Time: 07/24/2018 11:54 AM Performed by: Alvy Bimler, CRNA Pre-anesthesia Checklist: Patient identified, Patient being monitored, Timeout performed, Emergency Drugs available and Suction available Patient Re-evaluated:Patient Re-evaluated prior to induction Oxygen Delivery Method: Circle System Utilized Preoxygenation: Pre-oxygenation with 100% oxygen Induction Type: IV induction Ventilation: Mask ventilation without difficulty Laryngoscope Size: Mac and 3 Grade View: Grade II Tube type: Oral Tube size: 7.0 mm Number of attempts: 1 Airway Equipment and Method: stylet Placement Confirmation: ETT inserted through vocal cords under direct vision,  positive ETCO2 and breath sounds checked- equal and bilateral Secured at: 21 cm Tube secured with: Tape Dental Injury: Teeth and Oropharynx as per pre-operative assessment

## 2018-07-24 NOTE — Discharge Instructions (Addendum)
°  Post Anesthesia Home Care Instructions  Activity: Get plenty of rest for the remainder of the day. A responsible adult should stay with you for 24 hours following the procedure.  For the next 24 hours, DO NOT: -Drive a car -Advertising copywriter -Drink alcoholic beverages -Take any medication unless instructed by your physician -Make any legal decisions or sign important papers.  Meals: Start with liquid foods such as gelatin or soup. Progress to regular foods as tolerated. Avoid greasy, spicy, heavy foods. If nausea and/or vomiting occur, drink only clear liquids until the nausea and/or vomiting subsides. Call your physician if vomiting continues.  Special Instructions/Symptoms: Your throat may feel dry or sore from the anesthesia or the breathing tube placed in your throat during surgery. If this causes discomfort, gargle with warm salt water. The discomfort should disappear within 24 hours.  If you had a scopolamine patch placed behind your ear for the management of post- operative nausea and/or vomiting:  1. The medication in the patch is effective for 72 hours, after which it should be removed.  Wrap patch in a tissue and discard in the trash. Wash hands thoroughly with soap and water. 2. You may remove the patch earlier than 72 hours if you experience unpleasant side effects which may include dry mouth, dizziness or visual disturbances. 3. Avoid touching the patch. Wash your hands with soap and water after contact with the patch.     Information for Discharge Teaching: EXPAREL (bupivacaine liposome injectable suspension)   Your surgeon gave you EXPAREL(bupivacaine) in your surgical incision to help control your pain after surgery.   EXPAREL is a local anesthetic that provides pain relief by numbing the tissue around the surgical site.  EXPAREL is designed to release pain medication over time and can control pain for up to 72 hours.  Depending on how you respond to EXPAREL, you  may require less pain medication during your recovery.  Possible side effects:  Temporary loss of sensation or ability to move in the area where bupivacaine was injected.  Nausea, vomiting, constipation  Rarely, numbness and tingling in your mouth or lips, lightheadedness, or anxiety may occur.  Call your doctor right away if you think you may be experiencing any of these sensations, or if you have other questions regarding possible side effects.  Follow all other discharge instructions given to you by your surgeon or nurse. Eat a healthy diet and drink plenty of water or other fluids.  If you return to the hospital for any reason within 96 hours following the administration of EXPAREL, please inform your health care providers.  **Do not remove green Exparel bracelet for 96 hours(Friday)**

## 2018-07-24 NOTE — Transfer of Care (Signed)
Immediate Anesthesia Transfer of Care Note  Patient: [name]  Procedure(s) Performed: open umbilical hernia (N/A Abdomen)  Patient Location: PACU  Anesthesia Type:General  Level of Consciousness: awake, alert , oriented and patient cooperative  Airway & Oxygen Therapy: Patient Spontanous Breathing and Patient connected to nasal cannula oxygen  Post-op Assessment: Report given to RN, Post -op Vital signs reviewed and stable and Patient moving all extremities X 4  Post vital signs: Reviewed and stable  Last Vitals:  Vitals Value Taken Time  BP    Temp    Pulse    Resp    SpO2      Last Pain:  Vitals:   07/24/18 1053  TempSrc:   PainSc: 0-No pain      Patients Stated Pain Goal: 7 (07/24/18 1053)  Complications: No apparent anesthesia complications

## 2018-07-25 ENCOUNTER — Encounter (HOSPITAL_BASED_OUTPATIENT_CLINIC_OR_DEPARTMENT_OTHER): Payer: Self-pay | Admitting: General Surgery

## 2018-12-03 ENCOUNTER — Encounter (HOSPITAL_COMMUNITY): Payer: Self-pay | Admitting: Emergency Medicine

## 2018-12-03 ENCOUNTER — Other Ambulatory Visit: Payer: Self-pay

## 2018-12-03 ENCOUNTER — Ambulatory Visit (HOSPITAL_COMMUNITY)
Admission: EM | Admit: 2018-12-03 | Discharge: 2018-12-03 | Disposition: A | Discharge status: Home / Self Care | Payer: BLUE CROSS/BLUE SHIELD | Attending: Internal Medicine | Admitting: Internal Medicine

## 2018-12-03 DIAGNOSIS — H60541 Acute eczematoid otitis externa, right ear: Secondary | ICD-10-CM

## 2018-12-03 MED ORDER — NEOMYCIN-POLYMYXIN-HC 3.5-10000-1 OT SUSP: 3.0000 [drp] | Freq: Three times a day (TID) | OTIC | 0 refills | Status: AC

## 2018-12-03 NOTE — ED Triage Notes (Signed)
The patient presented to the [facility] with a complaint of right side otalgia x 2 months.

## 2018-12-03 NOTE — ED Provider Notes (Signed)
[facility]    CSN: [id number] Arrival date & time: 12/03/18  1017     History   Chief Complaint Chief Complaint  Patient presents with  . Otalgia    HPI [name] is a 48 y.o. male no significant past medical history presenting today for evaluation of right ear pain.  Patient states that over the past 2 months he has noted some dry skin behind his tragus, he has had gradually worsening discomfort over the past 2 weeks and has felt some swelling.  States that he was using a Q-tip yesterday and felt pain further inside his canal.  He has not used anything for symptoms.  Denies any fevers.  Denies associated URI symptoms.  HPI  Past Medical History:  Diagnosis Date  . Gallbladder polyp   . Umbilical hernia     There are no active problems to display for this patient.   Past Surgical History:  Procedure Laterality Date  . ANTERIOR CRUCIATE LIGAMENT REPAIR Right 2007  . ROTATOR CUFF REPAIR Right 2014  . UMBILICAL HERNIA REPAIR N/A 07/24/2018   Procedure: open umbilical hernia;  Surgeon: Sheliah HatchKinsinger, De BlanchLuke Aaron, MD;  [facility];  Service: General;  Laterality: N/A;       Home Medications    Prior to Admission medications   Medication Sig Start Date End Date Taking? Authorizing Provider  ibuprofen (ADVIL,MOTRIN) 800 MG tablet Take 1 tablet (800 mg total) by mouth every 8 (eight) hours as needed. 07/24/18  Yes Kinsinger, De BlanchLuke Aaron, MD  neomycin-polymyxin-hydrocortisone (CORTISPORIN) 3.5-10000-1 OTIC suspension Place 3 drops into the right ear 3 (three) times daily for 7 days. 12/03/18 12/10/18  Wieters, Junius CreamerHallie C, PA-C    Family History History reviewed. No pertinent family history.  Social History Social History   Tobacco Use  . Smoking status: Never Smoker  . Smokeless tobacco: Never Used  Substance Use Topics  . Alcohol use: Yes  . Drug use: Never     Allergies   Penicillins   Review of Systems Review of Systems    Constitutional: Negative for activity change, appetite change, chills, fatigue and fever.  HENT: Positive for ear pain. Negative for congestion, rhinorrhea, sinus pressure, sore throat and trouble swallowing.   Eyes: Negative for discharge and redness.  Respiratory: Negative for cough, chest tightness and shortness of breath.   Cardiovascular: Negative for chest pain.  Gastrointestinal: Negative for abdominal pain, diarrhea, nausea and vomiting.  Musculoskeletal: Negative for myalgias.  Skin: Negative for rash.  Neurological: Negative for dizziness, light-headedness and headaches.     Physical Exam Triage Vital Signs ED Triage Vitals  Enc Vitals Group     BP 12/03/18 1035 (!) 127/91     Pulse Rate 12/03/18 1035 70     Resp 12/03/18 1035 16     Temp 12/03/18 1035 98 F (36.7 C)     Temp Source 12/03/18 1035 Oral     SpO2 12/03/18 1035 97 %     Weight --      Height --      Head Circumference --      Peak Flow --      Pain Score 12/03/18 1034 5     Pain Loc --      Pain Edu? --      Excl. in GC? --    No data found.  Updated Vital Signs BP (!) 127/91 (BP Location: Left Arm)   Pulse 70   Temp 98 F (36.7 C) (  Oral)   Resp 16   SpO2 97%   Visual Acuity Right Eye Distance:   Left Eye Distance:   Bilateral Distance:    Right Eye Near:   Left Eye Near:    Bilateral Near:     Physical Exam Vitals signs and nursing note reviewed.  Constitutional:      Appearance: He is well-developed.  HENT:     Head: Normocephalic and atraumatic.     Ears:     Comments: Dry skin near opening of EAC on the right ear, mild erythema and swelling to external canal more distally, no significant swelling internally; right TM nonerythematous, no bulging, good bony landmarks  Eyes:     Conjunctiva/sclera: Conjunctivae normal.  Neck:     Musculoskeletal: Neck supple.     Comments: No palpable lymphadenopathy Cardiovascular:     Rate and Rhythm: Normal rate and regular rhythm.      Heart sounds: No murmur.  Pulmonary:     Effort: Pulmonary effort is normal. No respiratory distress.     Breath sounds: Normal breath sounds.  Abdominal:     Palpations: Abdomen is soft.     Tenderness: There is no abdominal tenderness.  Skin:    General: Skin is warm and dry.  Neurological:     Mental Status: He is alert.      UC Treatments / Results  Labs (all labs ordered are listed, but only abnormal results are displayed) Labs Reviewed - No data to display  EKG None  Radiology No results found.  Procedures Procedures (including critical care time)  Medications Ordered in UC Medications - No data to display  Initial Impression / Assessment and Plan / UC Course  I have reviewed the triage vital signs and the nursing notes.  Pertinent labs & imaging results that were available during my care of the patient were reviewed by me and considered in my medical decision making (see chart for details).     Patient with some mild erythema and swelling to distal EAC, possible eczema triggering symptoms.  Will treat with Cortisporin to treat for otitis externa at this time.  Tylenol and ibuprofen for pain and swelling.  Continue to monitor.Discussed strict return precautions. Patient verbalized understanding and is agreeable with plan.  Final Clinical Impressions(s) / UC Diagnoses   Final diagnoses:  Acute eczematoid otitis externa of right ear     Discharge Instructions     Please use ear drops 3 times a day for next week Use anti-inflammatories for pain/swelling. You may take up to 800 mg Ibuprofen every 8 hours with food. You may supplement Ibuprofen with Tylenol [phone] mg every 8 hours.  Follow up if pain and discomfort not improving, worsening, developing increased swelling    ED Prescriptions    Medication Sig Dispense Auth. Provider   neomycin-polymyxin-hydrocortisone (CORTISPORIN) 3.5-10000-1 OTIC suspension Place 3 drops into the right ear 3 (three) times  daily for 7 days. 10 mL Wieters, Hallie C, PA-C     Controlled Substance Prescriptions [place] Controlled Substance Registry consulted? Not Applicable   Lew DawesWieters, Hallie C, [place]-C 12/03/18 1050

## 2018-12-03 NOTE — Discharge Instructions (Signed)
Please use ear drops 3 times a day for next week Use anti-inflammatories for pain/swelling. You may take up to 800 mg Ibuprofen every 8 hours with food. You may supplement Ibuprofen with Tylenol [phone] mg every 8 hours.  Follow up if pain and discomfort not improving, worsening, developing increased swelling

## 2018-12-19 ENCOUNTER — Encounter (HOSPITAL_COMMUNITY): Payer: Self-pay

## 2018-12-19 ENCOUNTER — Ambulatory Visit (HOSPITAL_COMMUNITY)
Admission: EM | Admit: 2018-12-19 | Discharge: 2018-12-19 | Disposition: A | Discharge status: Home / Self Care | Payer: BLUE CROSS/BLUE SHIELD | Attending: Family Medicine | Admitting: Family Medicine

## 2018-12-19 DIAGNOSIS — T7840XA Allergy, unspecified, initial encounter: Secondary | ICD-10-CM | POA: Diagnosis not present

## 2018-12-19 LAB — POCT RAPID STREP A: Streptococcus, Group A Screen (Direct): NEGATIVE

## 2018-12-19 NOTE — ED Triage Notes (Signed)
Pt presents with sore throat for about 7 days.

## 2018-12-19 NOTE — ED Provider Notes (Signed)
[facility]    CSN: [id number] Arrival date & time: 12/19/18  1054     History   Chief Complaint Chief Complaint  Patient presents with  . Sore Throat    HPI [name] is a 48 y.o. male.   Pt is a 48 year old male that presents with approximately 1 week of rhinorrhea, mild cough, sore throat, pain with swallowing. Symptoms have been waxing and waning. He has been using OTC meds without relief. Denies any hx of allergies. No recent traveling or sick contacts. No fever, chills, myalgias.   ROS per HPI      Past Medical History:  Diagnosis Date  . Gallbladder polyp   . Umbilical hernia     There are no active problems to display for this patient.   Past Surgical History:  Procedure Laterality Date  . ANTERIOR CRUCIATE LIGAMENT REPAIR Right 2007  . ROTATOR CUFF REPAIR Right 2014  . UMBILICAL HERNIA REPAIR N/A 07/24/2018   Procedure: open umbilical hernia;  Surgeon: Sheliah Hatch, De Blanch, MD;  Location: [facility];  Service: General;  Laterality: N/A;       Home Medications    Prior to Admission medications   Medication Sig Start Date End Date Taking? Authorizing Provider  ibuprofen (ADVIL,MOTRIN) 800 MG tablet Take 1 tablet (800 mg total) by mouth every 8 (eight) hours as needed. 07/24/18   Kinsinger, De Blanch, MD    Family History Family History  Problem Relation Age of Onset  . Healthy Mother   . Healthy Father     Social History Social History   Tobacco Use  . Smoking status: Never Smoker  . Smokeless tobacco: Never Used  Substance Use Topics  . Alcohol use: Yes  . Drug use: Never     Allergies   Penicillins   Review of Systems Review of Systems   Physical Exam Triage Vital Signs ED Triage Vitals  Enc Vitals Group     BP 12/19/18 1108 (!) 148/80     Pulse Rate 12/19/18 1108 83     Resp 12/19/18 1108 18     Temp 12/19/18 1108 98.3 F (36.8 C)     Temp Source 12/19/18 1108 Oral     SpO2 12/19/18  1108 99 %     Weight --      Height --      Head Circumference --      Peak Flow --      Pain Score 12/19/18 1125 6     Pain Loc --      Pain Edu? --      Excl. in GC? --    No data found.  Updated Vital Signs BP (!) 148/80 (BP Location: Left Arm)   Pulse 83   Temp 98.3 F (36.8 C) (Oral)   Resp 18   SpO2 99%   Visual Acuity Right Eye Distance:   Left Eye Distance:   Bilateral Distance:    Right Eye Near:   Left Eye Near:    Bilateral Near:     Physical Exam Vitals signs and nursing note reviewed.  Constitutional:      General: He is not in acute distress.    Appearance: He is well-developed. He is not ill-appearing, toxic-appearing or diaphoretic.  HENT:     Head: Normocephalic and atraumatic.     Right Ear: Tympanic membrane and ear canal normal.     Left Ear: Tympanic membrane and ear canal normal.  Nose: Congestion present.     Mouth/Throat:     Pharynx: Posterior oropharyngeal erythema present.     Tonsils: No tonsillar exudate. Swelling: 1+ on the right. 1+ on the left.  Eyes:     Conjunctiva/sclera: Conjunctivae normal.  Neck:     Musculoskeletal: Normal range of motion.  Cardiovascular:     Rate and Rhythm: Normal rate and regular rhythm.     Heart sounds: Normal heart sounds.  Pulmonary:     Effort: Pulmonary effort is normal.     Breath sounds: Normal breath sounds.  Skin:    General: Skin is warm and dry.  Neurological:     Mental Status: He is alert.  Psychiatric:        Mood and Affect: Mood normal.      UC Treatments / Results  Labs (all labs ordered are listed, but only abnormal results are displayed) Labs Reviewed  CULTURE, GROUP A STREP [facility])  POCT RAPID STREP A    EKG None  Radiology No results found.  Procedures Procedures (including critical care time)  Medications Ordered in UC Medications - No data to display  Initial Impression / Assessment and Plan / UC Course  I have reviewed the triage vital signs and the  nursing notes.  Pertinent labs & imaging results that were available during my care of the patient were reviewed by me and considered in my medical decision making (see chart for details).     Rapid strep test was negative Symptoms are most likely related to allergies and postnasal drip. We will have him try Zyrtec, Claritin or Allegra for symptoms. He can do Flonase nasal spray for nasal congestion Follow up as needed for continued or worsening symptoms  Final Clinical Impressions(s) / UC Diagnoses   Final diagnoses:  Allergic state, initial encounter     Discharge Instructions     Your strep test was negative I believe the symptoms that you are having is related to allergies and the sore throat is coming from the postnasal drip You can try taking a daily Zyrtec, Allegra or Claritin for your symptoms For nasal congestion you could do Flonase nasal spray Follow up as needed for continued or worsening symptoms     ED Prescriptions    None     Controlled Substance Prescriptions [place] Controlled Substance Registry consulted? Not Applicable   [name], NP 12/19/18 1151

## 2018-12-19 NOTE — Discharge Instructions (Addendum)
Your strep test was negative I believe the symptoms that you are having is related to allergies and the sore throat is coming from the postnasal drip You can try taking a daily Zyrtec, Allegra or Claritin for your symptoms For nasal congestion you could do Flonase nasal spray Follow up as needed for continued or worsening symptoms

## 2018-12-21 LAB — CULTURE, GROUP A STREP (THRC)

## 2019-04-18 DIAGNOSIS — F411 Generalized anxiety disorder: Secondary | ICD-10-CM | POA: Diagnosis not present

## 2019-05-21 IMAGING — US US ABDOMEN COMPLETE
1 series · 13 of 25 positions shown · non-contrast
Comparison: None.

CLINICAL DATA: Upper abdomen pain, assess for strangulated hernia.

EXAM:
ABDOMEN ULTRASOUND COMPLETE

[Series 1: us abdomen complete · 13 of 130 slices shown]
[im 1/130]
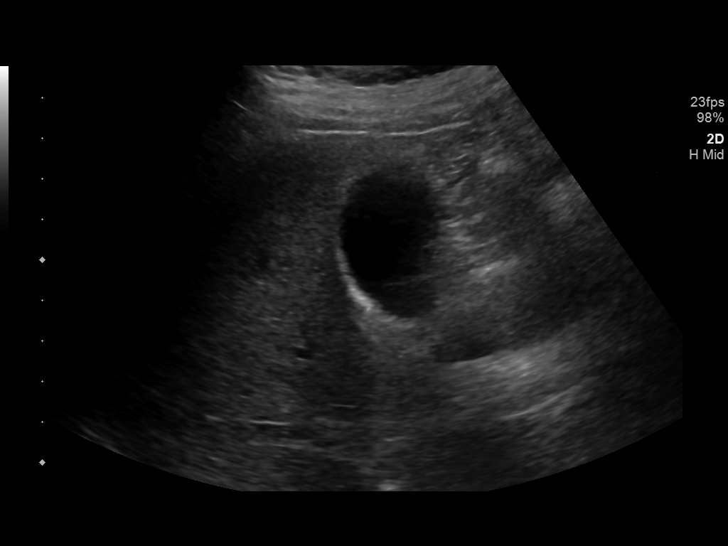
[im 11/130]
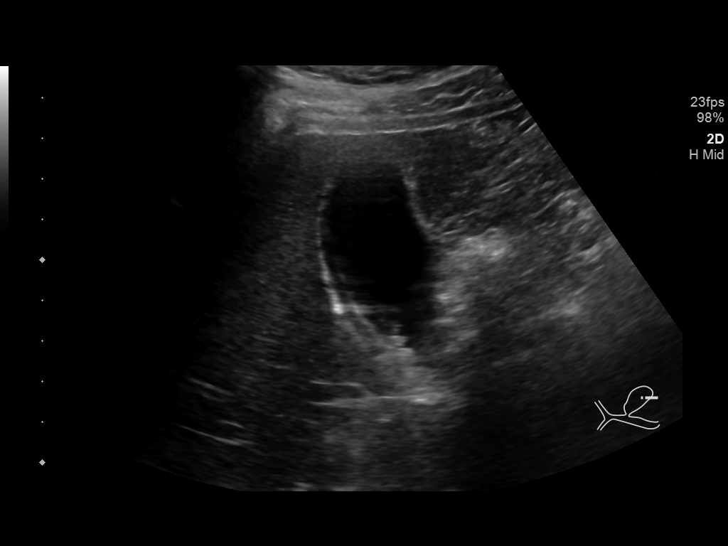
[im 22/130]
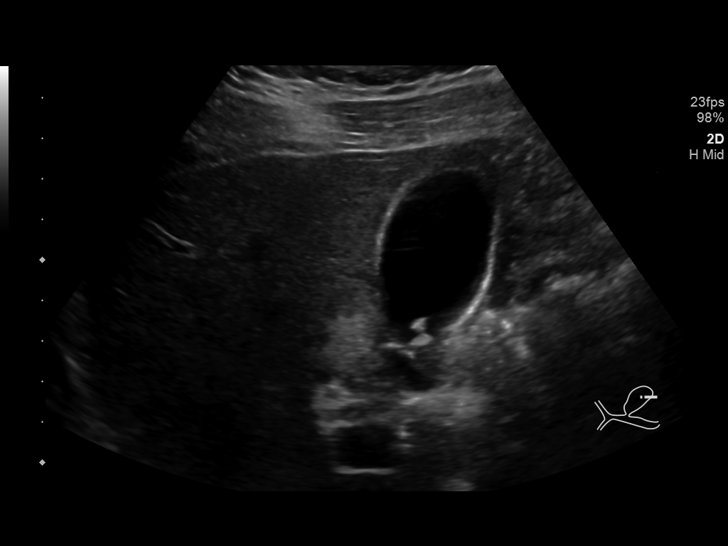
[im 33/130]
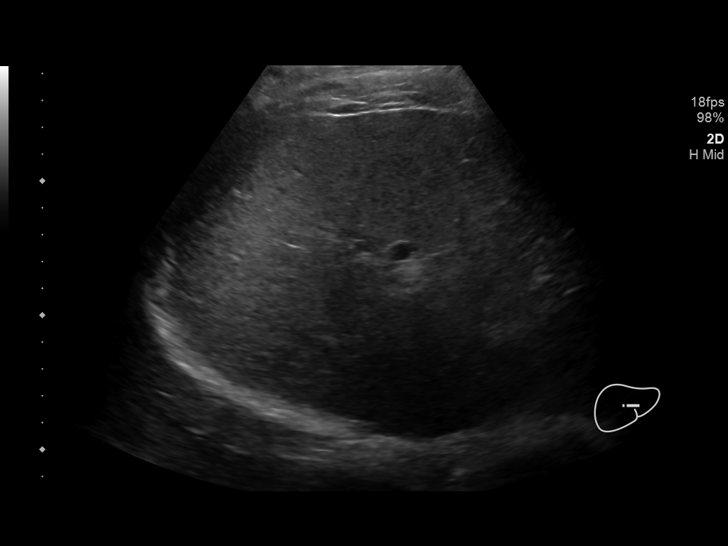
[im 44/130]
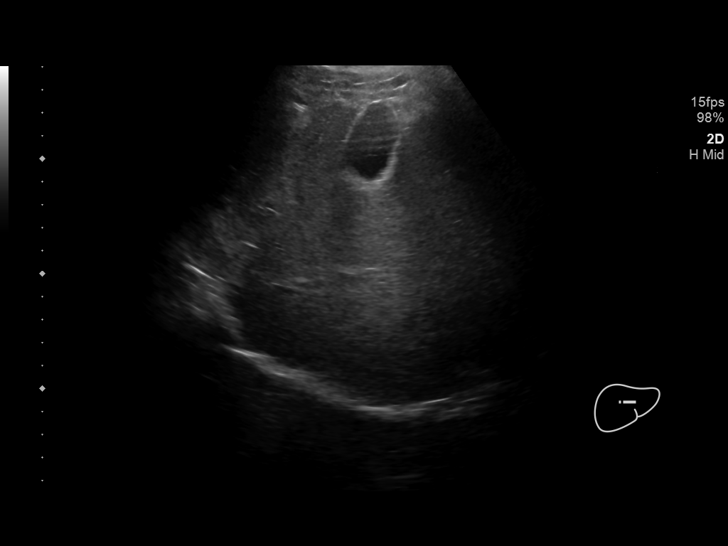
[im 54/130]
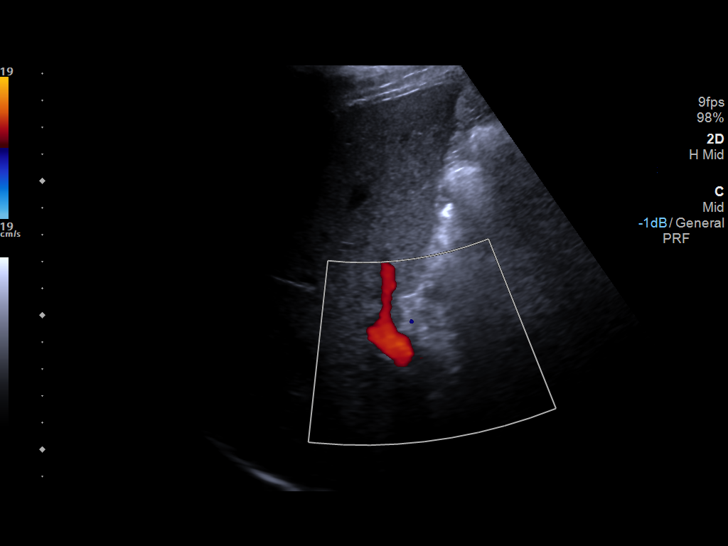
[im 65/130]
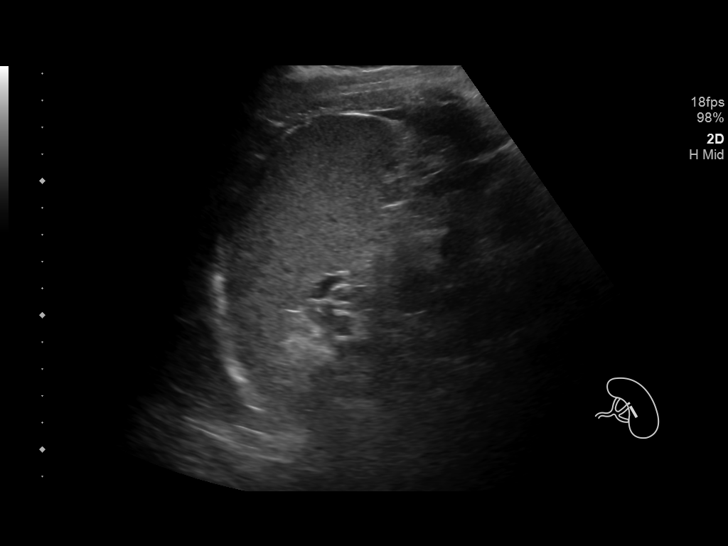
[im 76/130]
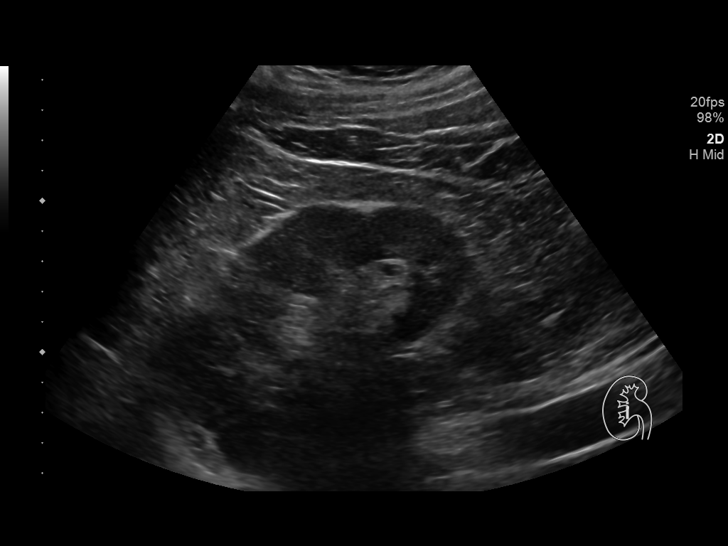
[im 87/130]
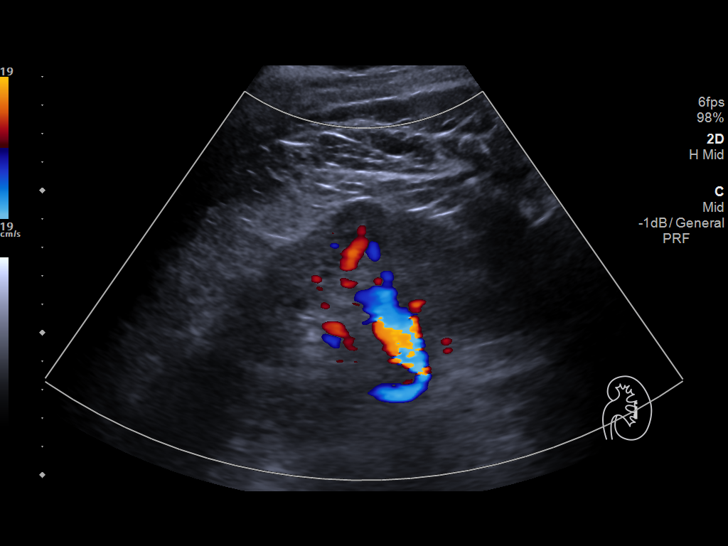
[im 97/130]
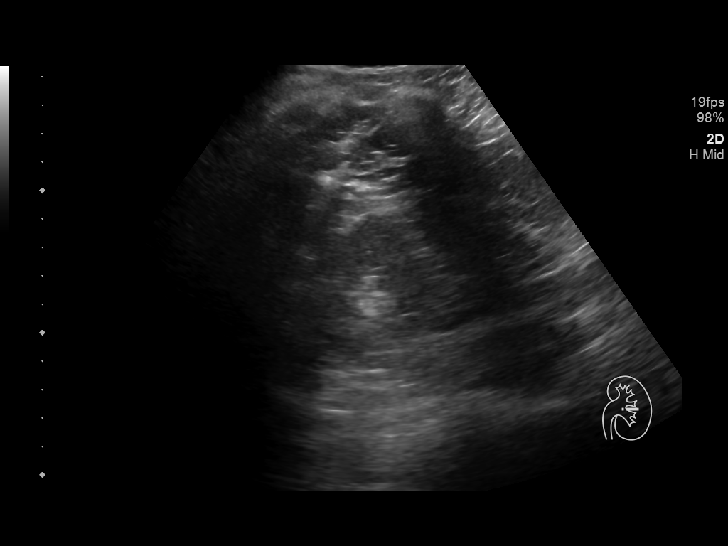
[im 108/130]
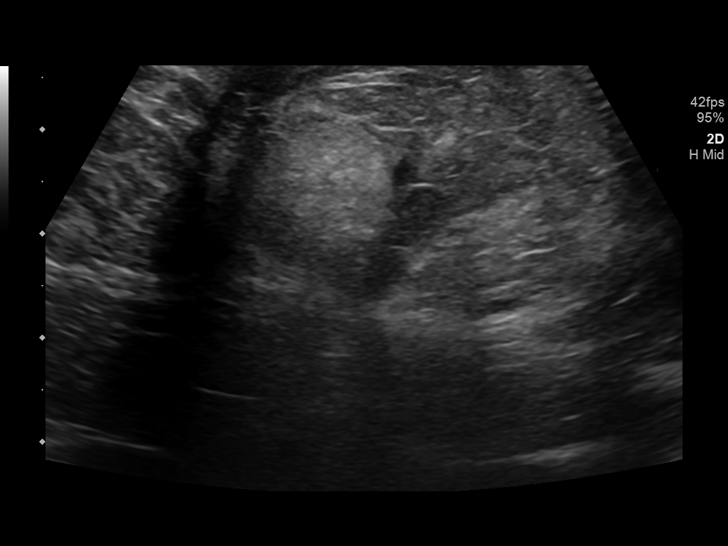
[im 119/130]
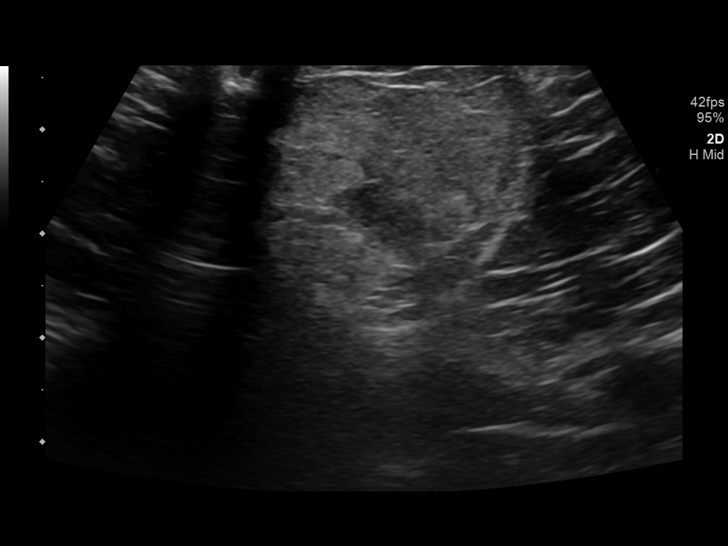
[im 130/130]
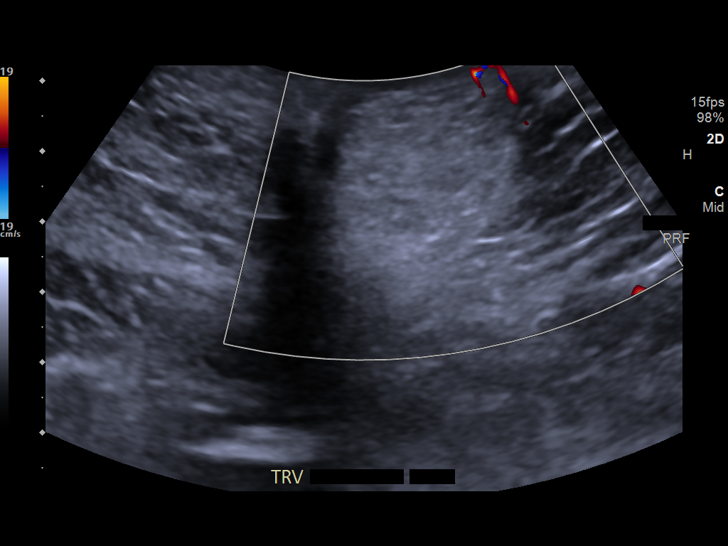

[13 of 25 positions shown; findings below may reference images not displayed]

FINDINGS: Gallbladder: There are polyps within the gallbladder, largest
measures 6 mm. There is no sonographic Murphy sign noted by
sonographer.

Common bile duct: Diameter: 4 mm

Liver: No focal lesion identified. Within normal limits in
parenchymal echogenicity. Portal vein is patent on color Doppler
imaging with normal direction of blood flow towards the liver.

IVC: No abnormality visualized.

Pancreas: Visualized portion unremarkable.

Spleen: Size and appearance within normal limits.

Right Kidney: Length: 10.7 cm. Echogenicity within normal limits. No
mass or hydronephrosis visualized.

Left Kidney: Length: 12.6 cm. Echogenicity within normal limits. No
mass or hydronephrosis visualized.

Abdominal aorta: No aneurysm visualized.

Other findings: Umbilicus area was imaged for assessment of hernia.
There is a 3.8 x 2.7 x 2.9 cm focal hyperechogenicity with color
flow but no peristalsis is noted. This may represent focal herniated
mesenteric fat.
IMPRESSION: There is 3.8 cm focal hyper echogenic area without peristalsis in
the umbilical area. This could represent focal herniated mesenteric
fat.

Gallbladder polyps.

## 2020-02-06 DIAGNOSIS — F4321 Adjustment disorder with depressed mood: Secondary | ICD-10-CM | POA: Diagnosis not present

## 2020-02-14 DIAGNOSIS — F411 Generalized anxiety disorder: Secondary | ICD-10-CM | POA: Diagnosis not present

## 2020-08-21 DIAGNOSIS — L309 Dermatitis, unspecified: Secondary | ICD-10-CM | POA: Diagnosis not present

## 2021-01-07 DIAGNOSIS — R5383 Other fatigue: Secondary | ICD-10-CM | POA: Diagnosis not present

## 2021-01-07 DIAGNOSIS — Z13818 Encounter for screening for other digestive system disorders: Secondary | ICD-10-CM | POA: Diagnosis not present

## 2021-01-07 DIAGNOSIS — L659 Nonscarring hair loss, unspecified: Secondary | ICD-10-CM | POA: Diagnosis not present

## 2021-01-07 DIAGNOSIS — Z139 Encounter for screening, unspecified: Secondary | ICD-10-CM | POA: Diagnosis not present

## 2021-01-28 DIAGNOSIS — R778 Other specified abnormalities of plasma proteins: Secondary | ICD-10-CM | POA: Diagnosis not present

## 2021-05-28 DIAGNOSIS — M25572 Pain in left ankle and joints of left foot: Secondary | ICD-10-CM | POA: Diagnosis not present

## 2021-05-28 DIAGNOSIS — M7732 Calcaneal spur, left foot: Secondary | ICD-10-CM | POA: Diagnosis not present

## 2021-05-28 DIAGNOSIS — M7989 Other specified soft tissue disorders: Secondary | ICD-10-CM | POA: Diagnosis not present

## 2021-05-28 DIAGNOSIS — S93402A Sprain of unspecified ligament of left ankle, initial encounter: Secondary | ICD-10-CM | POA: Diagnosis not present

## 2021-06-11 DIAGNOSIS — L03011 Cellulitis of right finger: Secondary | ICD-10-CM | POA: Diagnosis not present

## 2021-07-03 DIAGNOSIS — E291 Testicular hypofunction: Secondary | ICD-10-CM | POA: Diagnosis not present

## 2021-07-03 DIAGNOSIS — R5383 Other fatigue: Secondary | ICD-10-CM | POA: Diagnosis not present

## 2021-07-03 DIAGNOSIS — F329 Major depressive disorder, single episode, unspecified: Secondary | ICD-10-CM | POA: Diagnosis not present

## 2021-07-21 DIAGNOSIS — E538 Deficiency of other specified B group vitamins: Secondary | ICD-10-CM | POA: Diagnosis not present

## 2021-07-21 DIAGNOSIS — E291 Testicular hypofunction: Secondary | ICD-10-CM | POA: Diagnosis not present

## 2021-07-21 DIAGNOSIS — R5383 Other fatigue: Secondary | ICD-10-CM | POA: Diagnosis not present

## 2021-07-21 DIAGNOSIS — E611 Iron deficiency: Secondary | ICD-10-CM | POA: Diagnosis not present

## 2021-07-30 DIAGNOSIS — M545 Low back pain, unspecified: Secondary | ICD-10-CM | POA: Diagnosis not present

## 2021-07-30 DIAGNOSIS — M9903 Segmental and somatic dysfunction of lumbar region: Secondary | ICD-10-CM | POA: Diagnosis not present

## 2021-07-30 DIAGNOSIS — M47816 Spondylosis without myelopathy or radiculopathy, lumbar region: Secondary | ICD-10-CM | POA: Diagnosis not present

## 2021-07-30 DIAGNOSIS — M2550 Pain in unspecified joint: Secondary | ICD-10-CM | POA: Diagnosis not present

## 2021-07-31 DIAGNOSIS — M62838 Other muscle spasm: Secondary | ICD-10-CM | POA: Diagnosis not present

## 2021-07-31 DIAGNOSIS — M5135 Other intervertebral disc degeneration, thoracolumbar region: Secondary | ICD-10-CM | POA: Diagnosis not present

## 2021-08-03 DIAGNOSIS — M62838 Other muscle spasm: Secondary | ICD-10-CM | POA: Diagnosis not present

## 2021-08-03 DIAGNOSIS — M5135 Other intervertebral disc degeneration, thoracolumbar region: Secondary | ICD-10-CM | POA: Diagnosis not present

## 2021-08-04 DIAGNOSIS — E611 Iron deficiency: Secondary | ICD-10-CM | POA: Diagnosis not present

## 2021-08-04 DIAGNOSIS — R5383 Other fatigue: Secondary | ICD-10-CM | POA: Diagnosis not present

## 2021-08-06 DIAGNOSIS — M5135 Other intervertebral disc degeneration, thoracolumbar region: Secondary | ICD-10-CM | POA: Diagnosis not present

## 2021-08-06 DIAGNOSIS — M62838 Other muscle spasm: Secondary | ICD-10-CM | POA: Diagnosis not present

## 2021-08-10 DIAGNOSIS — R7982 Elevated C-reactive protein (CRP): Secondary | ICD-10-CM | POA: Diagnosis not present

## 2021-08-10 DIAGNOSIS — R700 Elevated erythrocyte sedimentation rate: Secondary | ICD-10-CM | POA: Diagnosis not present

## 2021-08-10 DIAGNOSIS — E7211 Homocystinuria: Secondary | ICD-10-CM | POA: Diagnosis not present

## 2021-08-10 DIAGNOSIS — K30 Functional dyspepsia: Secondary | ICD-10-CM | POA: Diagnosis not present

## 2021-08-19 DIAGNOSIS — K30 Functional dyspepsia: Secondary | ICD-10-CM | POA: Diagnosis not present

## 2021-08-19 DIAGNOSIS — E7211 Homocystinuria: Secondary | ICD-10-CM | POA: Diagnosis not present

## 2021-09-11 DIAGNOSIS — M62838 Other muscle spasm: Secondary | ICD-10-CM | POA: Diagnosis not present

## 2021-09-11 DIAGNOSIS — M5135 Other intervertebral disc degeneration, thoracolumbar region: Secondary | ICD-10-CM | POA: Diagnosis not present

## 2021-11-06 DIAGNOSIS — M62838 Other muscle spasm: Secondary | ICD-10-CM | POA: Diagnosis not present

## 2021-11-06 DIAGNOSIS — M5135 Other intervertebral disc degeneration, thoracolumbar region: Secondary | ICD-10-CM | POA: Diagnosis not present

## 2021-11-27 DIAGNOSIS — E612 Magnesium deficiency: Secondary | ICD-10-CM | POA: Diagnosis not present

## 2021-11-27 DIAGNOSIS — E7211 Homocystinuria: Secondary | ICD-10-CM | POA: Diagnosis not present

## 2021-11-27 DIAGNOSIS — Z1329 Encounter for screening for other suspected endocrine disorder: Secondary | ICD-10-CM | POA: Diagnosis not present

## 2021-11-27 DIAGNOSIS — Z79818 Long term (current) use of other agents affecting estrogen receptors and estrogen levels: Secondary | ICD-10-CM | POA: Diagnosis not present

## 2021-12-02 DIAGNOSIS — L738 Other specified follicular disorders: Secondary | ICD-10-CM | POA: Diagnosis not present

## 2021-12-02 DIAGNOSIS — Z7189 Other specified counseling: Secondary | ICD-10-CM | POA: Diagnosis not present

## 2021-12-02 DIAGNOSIS — D225 Melanocytic nevi of trunk: Secondary | ICD-10-CM | POA: Diagnosis not present

## 2022-03-01 DIAGNOSIS — M5135 Other intervertebral disc degeneration, thoracolumbar region: Secondary | ICD-10-CM | POA: Diagnosis not present

## 2022-03-01 DIAGNOSIS — M62838 Other muscle spasm: Secondary | ICD-10-CM | POA: Diagnosis not present

## 2022-03-03 DIAGNOSIS — M62838 Other muscle spasm: Secondary | ICD-10-CM | POA: Diagnosis not present

## 2022-03-03 DIAGNOSIS — M5135 Other intervertebral disc degeneration, thoracolumbar region: Secondary | ICD-10-CM | POA: Diagnosis not present

## 2022-03-04 DIAGNOSIS — M5135 Other intervertebral disc degeneration, thoracolumbar region: Secondary | ICD-10-CM | POA: Diagnosis not present

## 2022-03-04 DIAGNOSIS — M62838 Other muscle spasm: Secondary | ICD-10-CM | POA: Diagnosis not present

## 2022-03-18 DIAGNOSIS — R5383 Other fatigue: Secondary | ICD-10-CM | POA: Diagnosis not present

## 2022-07-08 DIAGNOSIS — F4323 Adjustment disorder with mixed anxiety and depressed mood: Secondary | ICD-10-CM | POA: Diagnosis not present

## 2022-07-14 DIAGNOSIS — F4323 Adjustment disorder with mixed anxiety and depressed mood: Secondary | ICD-10-CM | POA: Diagnosis not present

## 2022-07-16 DIAGNOSIS — F411 Generalized anxiety disorder: Secondary | ICD-10-CM | POA: Diagnosis not present

## 2022-08-12 DIAGNOSIS — F4320 Adjustment disorder, unspecified: Secondary | ICD-10-CM | POA: Diagnosis not present

## 2022-08-16 DIAGNOSIS — F4320 Adjustment disorder, unspecified: Secondary | ICD-10-CM | POA: Diagnosis not present

## 2022-08-30 DIAGNOSIS — F4320 Adjustment disorder, unspecified: Secondary | ICD-10-CM | POA: Diagnosis not present

## 2022-09-21 DIAGNOSIS — F4320 Adjustment disorder, unspecified: Secondary | ICD-10-CM | POA: Diagnosis not present

## 2022-11-20 DIAGNOSIS — H66001 Acute suppurative otitis media without spontaneous rupture of ear drum, right ear: Secondary | ICD-10-CM | POA: Diagnosis not present

## 2022-12-02 DIAGNOSIS — G8929 Other chronic pain: Secondary | ICD-10-CM | POA: Diagnosis not present

## 2022-12-02 DIAGNOSIS — M25511 Pain in right shoulder: Secondary | ICD-10-CM | POA: Diagnosis not present

## 2022-12-02 DIAGNOSIS — M898X1 Other specified disorders of bone, shoulder: Secondary | ICD-10-CM | POA: Diagnosis not present

## 2022-12-23 DIAGNOSIS — E291 Testicular hypofunction: Secondary | ICD-10-CM | POA: Diagnosis not present

## 2022-12-23 DIAGNOSIS — Z79899 Other long term (current) drug therapy: Secondary | ICD-10-CM | POA: Diagnosis not present

## 2023-02-11 DIAGNOSIS — M25552 Pain in left hip: Secondary | ICD-10-CM | POA: Diagnosis not present

## 2023-02-11 DIAGNOSIS — M6281 Muscle weakness (generalized): Secondary | ICD-10-CM | POA: Diagnosis not present

## 2023-02-11 DIAGNOSIS — M545 Low back pain, unspecified: Secondary | ICD-10-CM | POA: Diagnosis not present

## 2023-02-24 DIAGNOSIS — M25552 Pain in left hip: Secondary | ICD-10-CM | POA: Diagnosis not present

## 2023-02-24 DIAGNOSIS — M6281 Muscle weakness (generalized): Secondary | ICD-10-CM | POA: Diagnosis not present

## 2023-02-24 DIAGNOSIS — M545 Low back pain, unspecified: Secondary | ICD-10-CM | POA: Diagnosis not present

## 2023-07-10 DIAGNOSIS — Z1211 Encounter for screening for malignant neoplasm of colon: Secondary | ICD-10-CM | POA: Diagnosis not present

## 2023-08-16 DIAGNOSIS — L218 Other seborrheic dermatitis: Secondary | ICD-10-CM | POA: Diagnosis not present

## 2023-08-16 DIAGNOSIS — L309 Dermatitis, unspecified: Secondary | ICD-10-CM | POA: Diagnosis not present

## 2023-08-16 DIAGNOSIS — D2239 Melanocytic nevi of other parts of face: Secondary | ICD-10-CM | POA: Diagnosis not present

## 2023-09-20 DIAGNOSIS — R778 Other specified abnormalities of plasma proteins: Secondary | ICD-10-CM | POA: Diagnosis not present

## 2023-09-20 DIAGNOSIS — R030 Elevated blood-pressure reading, without diagnosis of hypertension: Secondary | ICD-10-CM | POA: Diagnosis not present

## 2023-09-20 DIAGNOSIS — L659 Nonscarring hair loss, unspecified: Secondary | ICD-10-CM | POA: Diagnosis not present

## 2023-09-20 DIAGNOSIS — R5383 Other fatigue: Secondary | ICD-10-CM | POA: Diagnosis not present

## 2023-12-01 DIAGNOSIS — J9809 Other diseases of bronchus, not elsewhere classified: Secondary | ICD-10-CM | POA: Diagnosis not present

## 2023-12-01 DIAGNOSIS — R509 Fever, unspecified: Secondary | ICD-10-CM | POA: Diagnosis not present

## 2023-12-01 DIAGNOSIS — R059 Cough, unspecified: Secondary | ICD-10-CM | POA: Diagnosis not present

## 2023-12-01 DIAGNOSIS — J101 Influenza due to other identified influenza virus with other respiratory manifestations: Secondary | ICD-10-CM | POA: Diagnosis not present

## 2023-12-13 DIAGNOSIS — L659 Nonscarring hair loss, unspecified: Secondary | ICD-10-CM | POA: Diagnosis not present

## 2023-12-13 DIAGNOSIS — R5383 Other fatigue: Secondary | ICD-10-CM | POA: Diagnosis not present

## 2023-12-13 DIAGNOSIS — E030 Congenital hypothyroidism with diffuse goiter: Secondary | ICD-10-CM | POA: Diagnosis not present

## 2023-12-13 DIAGNOSIS — R778 Other specified abnormalities of plasma proteins: Secondary | ICD-10-CM | POA: Diagnosis not present

## 2024-02-02 DIAGNOSIS — Z758 Other problems related to medical facilities and other health care: Secondary | ICD-10-CM | POA: Diagnosis not present

## 2024-02-02 DIAGNOSIS — R109 Unspecified abdominal pain: Secondary | ICD-10-CM | POA: Diagnosis not present

## 2024-02-03 DIAGNOSIS — K353 Acute appendicitis with localized peritonitis, without perforation or gangrene: Secondary | ICD-10-CM | POA: Diagnosis not present

## 2024-02-03 DIAGNOSIS — R109 Unspecified abdominal pain: Secondary | ICD-10-CM | POA: Diagnosis not present

## 2024-02-03 DIAGNOSIS — Z880 Allergy status to penicillin: Secondary | ICD-10-CM | POA: Diagnosis not present

## 2024-02-03 DIAGNOSIS — K3580 Unspecified acute appendicitis: Secondary | ICD-10-CM | POA: Diagnosis not present

## 2024-02-03 DIAGNOSIS — K8020 Calculus of gallbladder without cholecystitis without obstruction: Secondary | ICD-10-CM | POA: Diagnosis not present

## 2024-02-04 DIAGNOSIS — K37 Unspecified appendicitis: Secondary | ICD-10-CM | POA: Diagnosis not present

## 2024-02-04 DIAGNOSIS — K353 Acute appendicitis with localized peritonitis, without perforation or gangrene: Secondary | ICD-10-CM | POA: Diagnosis not present

## 2024-02-04 DIAGNOSIS — K8020 Calculus of gallbladder without cholecystitis without obstruction: Secondary | ICD-10-CM | POA: Diagnosis not present

## 2024-02-04 DIAGNOSIS — Z880 Allergy status to penicillin: Secondary | ICD-10-CM | POA: Diagnosis not present

## 2024-02-04 DIAGNOSIS — R109 Unspecified abdominal pain: Secondary | ICD-10-CM | POA: Diagnosis not present

## 2024-02-28 DIAGNOSIS — R5383 Other fatigue: Secondary | ICD-10-CM | POA: Diagnosis not present

## 2024-02-28 DIAGNOSIS — R778 Other specified abnormalities of plasma proteins: Secondary | ICD-10-CM | POA: Diagnosis not present

## 2024-02-28 DIAGNOSIS — Z131 Encounter for screening for diabetes mellitus: Secondary | ICD-10-CM | POA: Diagnosis not present

## 2024-02-28 DIAGNOSIS — Z1321 Encounter for screening for nutritional disorder: Secondary | ICD-10-CM | POA: Diagnosis not present

## 2024-02-28 DIAGNOSIS — L659 Nonscarring hair loss, unspecified: Secondary | ICD-10-CM | POA: Diagnosis not present

## 2024-02-28 DIAGNOSIS — E030 Congenital hypothyroidism with diffuse goiter: Secondary | ICD-10-CM | POA: Diagnosis not present

## 2024-03-15 DIAGNOSIS — F411 Generalized anxiety disorder: Secondary | ICD-10-CM | POA: Diagnosis not present

## 2024-03-15 DIAGNOSIS — Z79899 Other long term (current) drug therapy: Secondary | ICD-10-CM | POA: Diagnosis not present

## 2024-05-17 DIAGNOSIS — R7401 Elevation of levels of liver transaminase levels: Secondary | ICD-10-CM | POA: Diagnosis not present

## 2024-05-17 DIAGNOSIS — Z0189 Encounter for other specified special examinations: Secondary | ICD-10-CM | POA: Diagnosis not present

## 2024-05-17 DIAGNOSIS — L659 Nonscarring hair loss, unspecified: Secondary | ICD-10-CM | POA: Diagnosis not present

## 2024-05-17 DIAGNOSIS — Z1321 Encounter for screening for nutritional disorder: Secondary | ICD-10-CM | POA: Diagnosis not present

## 2024-05-17 DIAGNOSIS — Z1329 Encounter for screening for other suspected endocrine disorder: Secondary | ICD-10-CM | POA: Diagnosis not present

## 2024-05-31 DIAGNOSIS — Z0000 Encounter for general adult medical examination without abnormal findings: Secondary | ICD-10-CM | POA: Diagnosis not present

## 2024-06-18 DIAGNOSIS — K439 Ventral hernia without obstruction or gangrene: Secondary | ICD-10-CM | POA: Diagnosis not present

## 2024-06-26 DIAGNOSIS — K439 Ventral hernia without obstruction or gangrene: Secondary | ICD-10-CM | POA: Diagnosis not present

## 2024-07-03 DIAGNOSIS — K8020 Calculus of gallbladder without cholecystitis without obstruction: Secondary | ICD-10-CM | POA: Diagnosis not present

## 2024-07-03 DIAGNOSIS — K430 Incisional hernia with obstruction, without gangrene: Secondary | ICD-10-CM | POA: Diagnosis not present

## 2024-07-04 DIAGNOSIS — D120 Benign neoplasm of cecum: Secondary | ICD-10-CM | POA: Diagnosis not present

## 2024-07-04 DIAGNOSIS — K635 Polyp of colon: Secondary | ICD-10-CM | POA: Diagnosis not present

## 2024-07-04 DIAGNOSIS — Z1211 Encounter for screening for malignant neoplasm of colon: Secondary | ICD-10-CM | POA: Diagnosis not present

## 2024-07-20 DIAGNOSIS — Z79811 Long term (current) use of aromatase inhibitors: Secondary | ICD-10-CM | POA: Diagnosis not present

## 2024-07-20 DIAGNOSIS — K8010 Calculus of gallbladder with chronic cholecystitis without obstruction: Secondary | ICD-10-CM | POA: Diagnosis not present

## 2024-07-20 DIAGNOSIS — R1033 Periumbilical pain: Secondary | ICD-10-CM | POA: Diagnosis not present

## 2024-07-20 DIAGNOSIS — G8918 Other acute postprocedural pain: Secondary | ICD-10-CM | POA: Diagnosis not present

## 2024-07-20 DIAGNOSIS — Z880 Allergy status to penicillin: Secondary | ICD-10-CM | POA: Diagnosis not present

## 2024-07-20 DIAGNOSIS — K430 Incisional hernia with obstruction, without gangrene: Secondary | ICD-10-CM | POA: Diagnosis not present

## 2024-08-23 DIAGNOSIS — E559 Vitamin D deficiency, unspecified: Secondary | ICD-10-CM | POA: Diagnosis not present

## 2024-08-23 DIAGNOSIS — N62 Hypertrophy of breast: Secondary | ICD-10-CM | POA: Diagnosis not present

## 2024-08-23 DIAGNOSIS — D751 Secondary polycythemia: Secondary | ICD-10-CM | POA: Diagnosis not present

## 2024-08-23 DIAGNOSIS — E291 Testicular hypofunction: Secondary | ICD-10-CM | POA: Diagnosis not present

## 2024-10-02 DIAGNOSIS — D751 Secondary polycythemia: Secondary | ICD-10-CM | POA: Diagnosis not present

## 2024-10-15 DIAGNOSIS — L738 Other specified follicular disorders: Secondary | ICD-10-CM | POA: Diagnosis not present

## 2024-10-15 DIAGNOSIS — L814 Other melanin hyperpigmentation: Secondary | ICD-10-CM | POA: Diagnosis not present
